# Patient Record
Sex: Female | Born: 1960 | Race: White | Hispanic: No | Marital: Married | Smoking: Never smoker
Health system: Southern US, Community
[De-identification: ages and names within clinical notes are randomized; demographics above are authoritative.]

## PROBLEM LIST (undated history)

## (undated) DIAGNOSIS — E079 Disorder of thyroid, unspecified: Secondary | ICD-10-CM

## (undated) DIAGNOSIS — E039 Hypothyroidism, unspecified: Secondary | ICD-10-CM

## (undated) DIAGNOSIS — Z780 Asymptomatic menopausal state: Secondary | ICD-10-CM

## (undated) DIAGNOSIS — T884XXA Failed or difficult intubation, initial encounter: Secondary | ICD-10-CM

## (undated) DIAGNOSIS — E119 Type 2 diabetes mellitus without complications: Secondary | ICD-10-CM

## (undated) DIAGNOSIS — R011 Cardiac murmur, unspecified: Secondary | ICD-10-CM

## (undated) DIAGNOSIS — F419 Anxiety disorder, unspecified: Secondary | ICD-10-CM

## (undated) DIAGNOSIS — I10 Essential (primary) hypertension: Secondary | ICD-10-CM

## (undated) DIAGNOSIS — Z87442 Personal history of urinary calculi: Secondary | ICD-10-CM

## (undated) HISTORY — DX: Disorder of thyroid, unspecified: E07.9

## (undated) HISTORY — DX: Type 2 diabetes mellitus without complications: E11.9

## (undated) HISTORY — PX: CHOLECYSTECTOMY: SHX55

## (undated) HISTORY — DX: Essential (primary) hypertension: I10

## (undated) HISTORY — DX: Asymptomatic menopausal state: Z78.0

---

## 1999-07-14 DIAGNOSIS — Z87442 Personal history of urinary calculi: Secondary | ICD-10-CM

## 1999-07-14 HISTORY — DX: Personal history of urinary calculi: Z87.442

## 2011-09-05 ENCOUNTER — Inpatient Hospital Stay: Payer: Self-pay | Admitting: Surgery

## 2011-09-05 LAB — URINALYSIS, COMPLETE
Bacteria: NONE SEEN
Bilirubin,UR: NEGATIVE
Glucose,UR: NEGATIVE mg/dL (ref 0–75)
Hyaline Cast: 8
Ketone: NEGATIVE
Leukocyte Esterase: NEGATIVE
Nitrite: NEGATIVE
Ph: 5 (ref 4.5–8.0)
Protein: NEGATIVE
RBC,UR: 2 /HPF (ref 0–5)
Specific Gravity: 1.024 (ref 1.003–1.030)
Squamous Epithelial: 2
WBC UR: 4 /HPF (ref 0–5)

## 2011-09-05 LAB — COMPREHENSIVE METABOLIC PANEL
Alkaline Phosphatase: 70 U/L (ref 50–136)
Bilirubin,Total: 0.7 mg/dL (ref 0.2–1.0)
Calcium, Total: 10.1 mg/dL (ref 8.5–10.1)
Chloride: 98 mmol/L (ref 98–107)
Co2: 26 mmol/L (ref 21–32)
Creatinine: 1.55 mg/dL — ABNORMAL HIGH (ref 0.60–1.30)
EGFR (Non-African Amer.): 37 — ABNORMAL LOW
Glucose: 158 mg/dL — ABNORMAL HIGH (ref 65–99)
Osmolality: 281 (ref 275–301)
SGPT (ALT): 74 U/L
Sodium: 136 mmol/L (ref 136–145)
Total Protein: 7.3 g/dL (ref 6.4–8.2)

## 2011-09-05 LAB — CBC
HCT: 43.8 % (ref 35.0–47.0)
MCV: 86 fL (ref 80–100)
RBC: 5.09 10*6/uL (ref 3.80–5.20)
WBC: 13.2 10*3/uL — ABNORMAL HIGH (ref 3.6–11.0)

## 2011-09-05 LAB — LIPASE, BLOOD: Lipase: >3000 U/L (ref 73–393)

## 2011-09-06 LAB — CBC WITH DIFFERENTIAL/PLATELET
Basophil #: 0 10*3/uL (ref 0.0–0.1)
Basophil %: 0.1 %
Eosinophil #: 0 10*3/uL (ref 0.0–0.7)
Eosinophil %: 0 %
HCT: 37.5 % (ref 35.0–47.0)
Lymphocyte %: 4.4 %
MCH: 29 pg (ref 26.0–34.0)
MCV: 87 fL (ref 80–100)
Monocyte %: 7.3 %
Neutrophil #: 8.7 10*3/uL — ABNORMAL HIGH (ref 1.4–6.5)
RDW: 14.5 % (ref 11.5–14.5)
WBC: 9.9 10*3/uL (ref 3.6–11.0)

## 2011-09-06 LAB — COMPREHENSIVE METABOLIC PANEL
Albumin: 3 g/dL — ABNORMAL LOW (ref 3.4–5.0)
Alkaline Phosphatase: 54 U/L (ref 50–136)
Anion Gap: 11 (ref 7–16)
Calcium, Total: 8.2 mg/dL — ABNORMAL LOW (ref 8.5–10.1)
Chloride: 103 mmol/L (ref 98–107)
EGFR (African American): >60
EGFR (Non-African Amer.): 54 — ABNORMAL LOW
Glucose: 103 mg/dL — ABNORMAL HIGH (ref 65–99)
Osmolality: 285 (ref 275–301)
Potassium: 3.6 mmol/L (ref 3.5–5.1)
SGOT(AST): 67 U/L — ABNORMAL HIGH (ref 15–37)

## 2011-09-06 LAB — LIPASE, BLOOD: Lipase: 4550 U/L — ABNORMAL HIGH (ref 73–393)

## 2011-09-06 LAB — LIPID PANEL
Ldl Cholesterol, Calc: 41 mg/dL (ref 0–100)
Triglycerides: 73 mg/dL (ref 0–200)

## 2011-09-06 LAB — MAGNESIUM: Magnesium: 1.5 mg/dL — ABNORMAL LOW

## 2011-09-07 LAB — COMPREHENSIVE METABOLIC PANEL
Anion Gap: 13 (ref 7–16)
Bilirubin,Total: 0.5 mg/dL (ref 0.2–1.0)
Chloride: 109 mmol/L — ABNORMAL HIGH (ref 98–107)
Co2: 23 mmol/L (ref 21–32)
Creatinine: 0.82 mg/dL (ref 0.60–1.30)
EGFR (African American): >60
EGFR (Non-African Amer.): >60
Osmolality: 290 (ref 275–301)
Potassium: 4.3 mmol/L (ref 3.5–5.1)
SGPT (ALT): 30 U/L
Sodium: 145 mmol/L (ref 136–145)

## 2011-09-07 LAB — PROTIME-INR
INR: 1.2
Prothrombin Time: 15.3 secs — ABNORMAL HIGH (ref 11.5–14.7)

## 2011-09-08 LAB — HEPATIC FUNCTION PANEL A (ARMC)
Albumin: 2.6 g/dL — ABNORMAL LOW (ref 3.4–5.0)
Alkaline Phosphatase: 49 U/L — ABNORMAL LOW (ref 50–136)
Bilirubin, Direct: 0.1 mg/dL (ref 0.00–0.20)
Bilirubin,Total: 0.5 mg/dL (ref 0.2–1.0)
SGPT (ALT): 23 U/L

## 2011-09-09 LAB — CBC WITH DIFFERENTIAL/PLATELET
Eosinophil #: 0.1 10*3/uL (ref 0.0–0.7)
Eosinophil %: 1.3 %
Lymphocyte #: 1.1 10*3/uL (ref 1.0–3.6)
Monocyte #: 0.8 10*3/uL — ABNORMAL HIGH (ref 0.0–0.7)
Monocyte %: 7.5 %
Neutrophil #: 8.2 10*3/uL — ABNORMAL HIGH (ref 1.4–6.5)
Neutrophil %: 79.8 %
Platelet: 224 10*3/uL (ref 150–440)
RBC: 3.37 10*6/uL — ABNORMAL LOW (ref 3.80–5.20)
WBC: 10.2 10*3/uL (ref 3.6–11.0)

## 2011-09-09 LAB — COMPREHENSIVE METABOLIC PANEL
Albumin: 2.2 g/dL — ABNORMAL LOW (ref 3.4–5.0)
Alkaline Phosphatase: 44 U/L — ABNORMAL LOW (ref 50–136)
Bilirubin,Total: 0.3 mg/dL (ref 0.2–1.0)
Calcium, Total: 7.8 mg/dL — ABNORMAL LOW (ref 8.5–10.1)
Co2: 26 mmol/L (ref 21–32)
Creatinine: 0.56 mg/dL — ABNORMAL LOW (ref 0.60–1.30)
EGFR (African American): >60
EGFR (Non-African Amer.): >60
Potassium: 4.1 mmol/L (ref 3.5–5.1)
SGOT(AST): 21 U/L (ref 15–37)
SGPT (ALT): 18 U/L
Sodium: 141 mmol/L (ref 136–145)

## 2013-02-09 IMAGING — US ABDOMEN ULTRASOUND LIMITED
1 series · 17 of 25 positions shown · non-contrast
Comparison: none

REASON FOR EXAM: RUQ pain n/v +murphy sign
COMMENTS:   Body Site: GB and Fossa, CBD, Head of Pancreas

PROCEDURE:     US  - US ABDOMEN LIMITED SURVEY  - September 05, 2011  [DATE]
RESULT:     Comparison: None.
TECHNIQUE: Multiple grayscale and color Doppler images were obtained of the
right upper quadrant.

[Series 1: abdomen ultrasound limited · 17 of 38 slices shown]
[im 1/38]
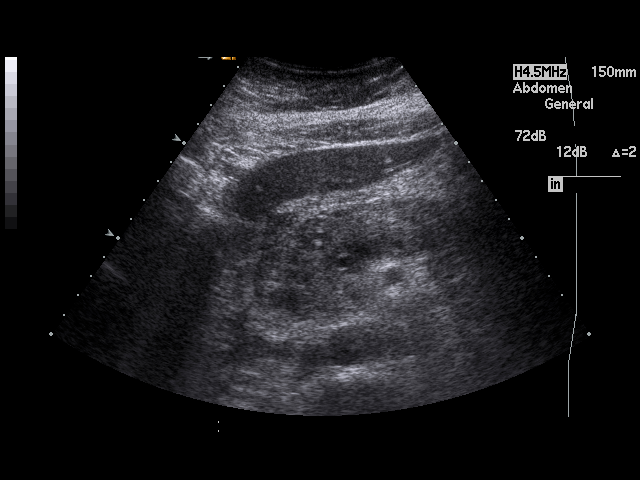
[im 4/38]
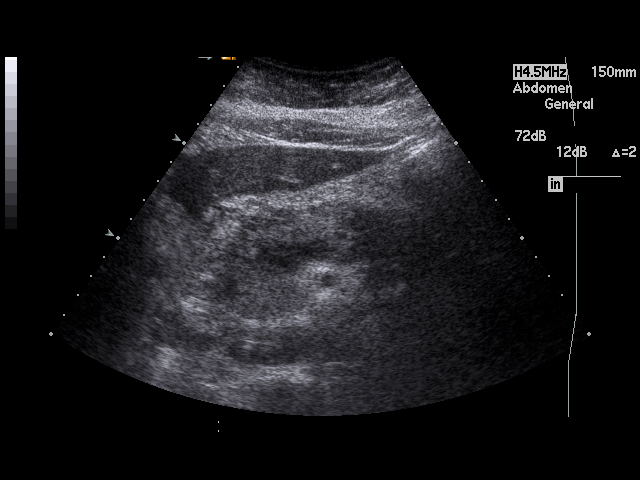
[im 5/38]
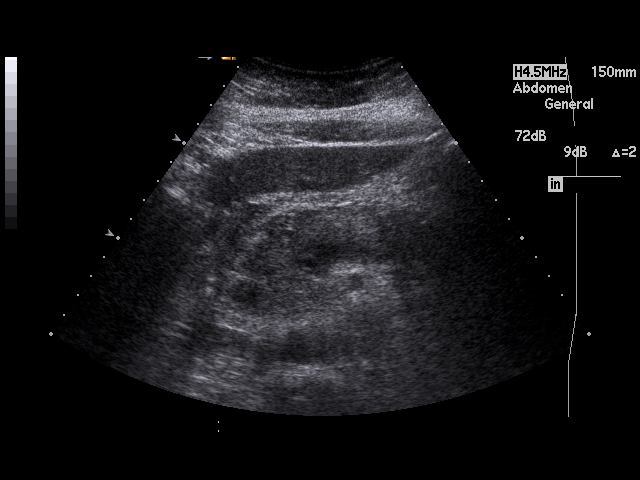
[im 8/38]
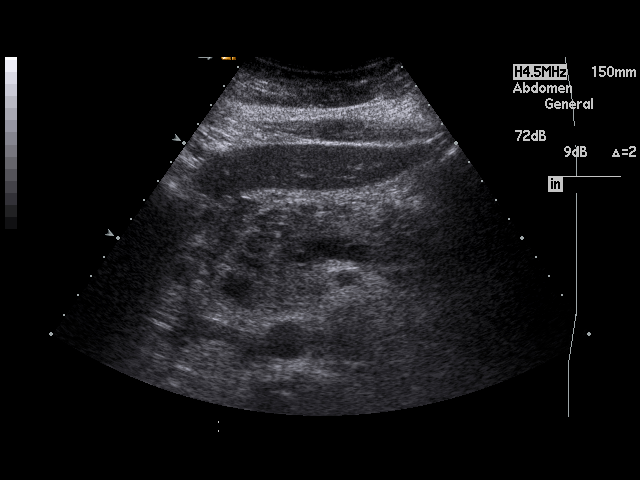
[im 10/38]
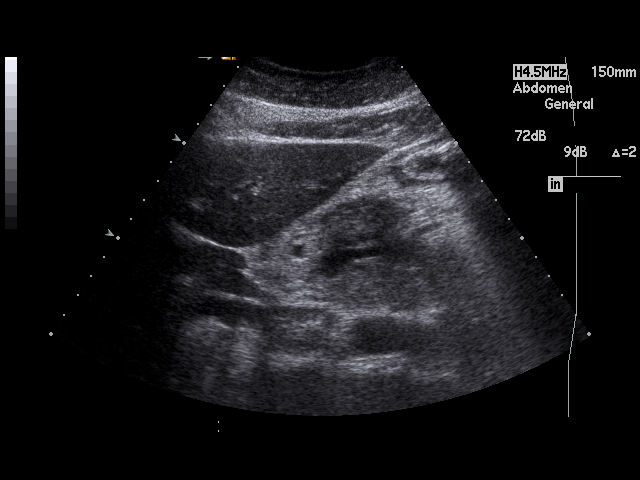
[im 13/38]
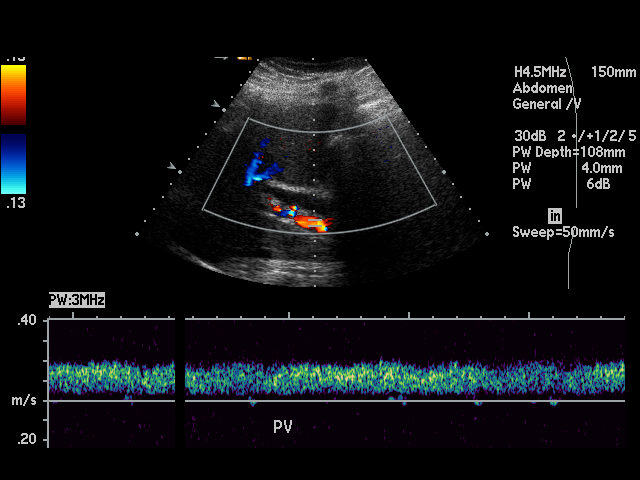
[im 14/38]
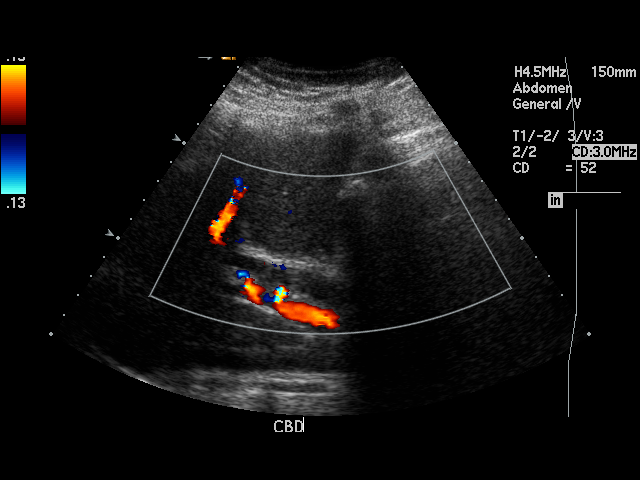
[im 17/38]
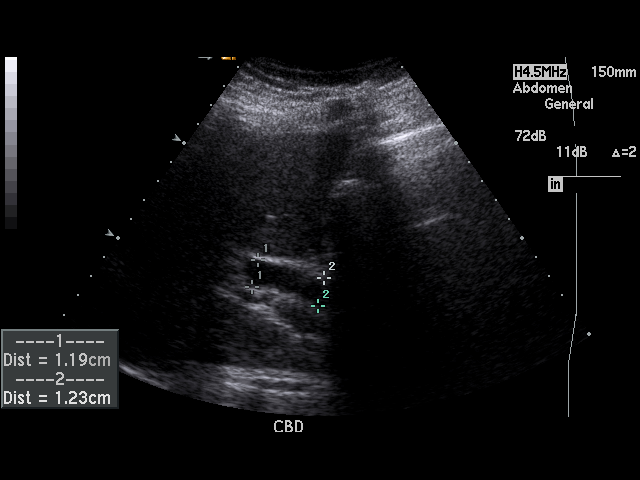
[im 19/38]
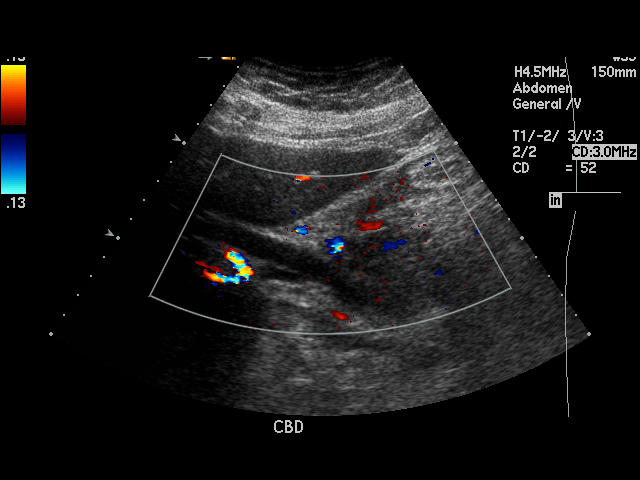
[im 21/38]
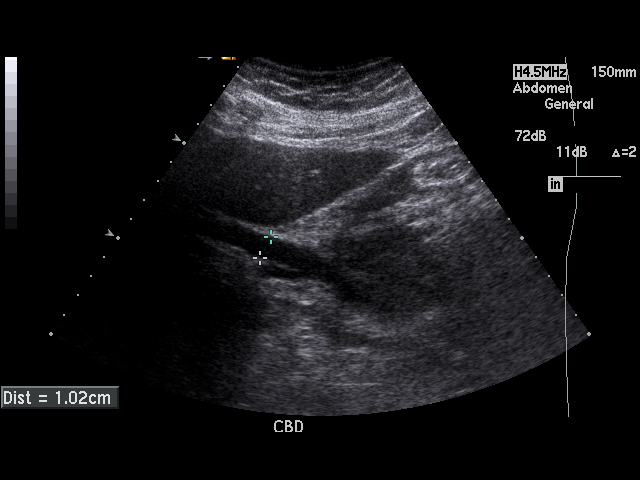
[im 24/38]
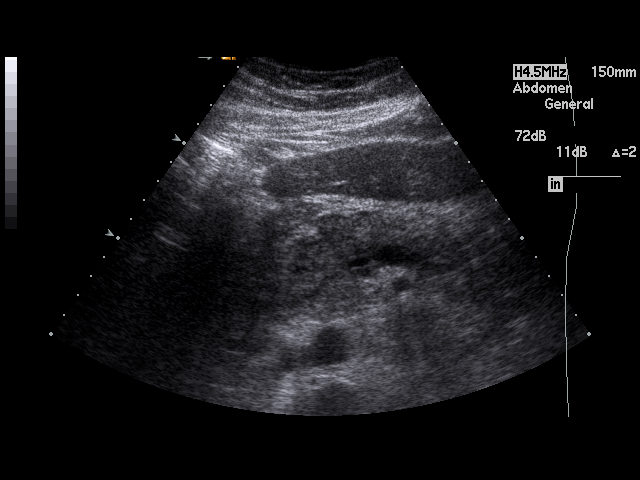
[im 25/38]
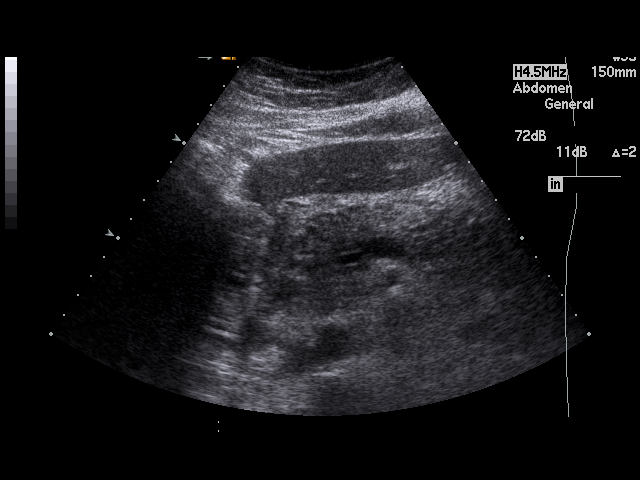
[im 28/38]
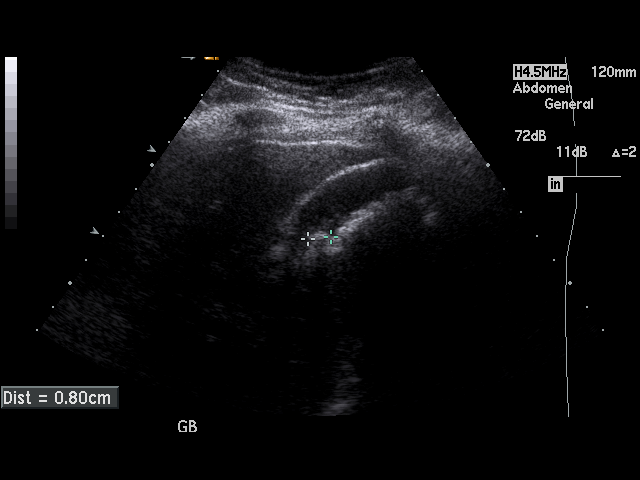
[im 30/38]
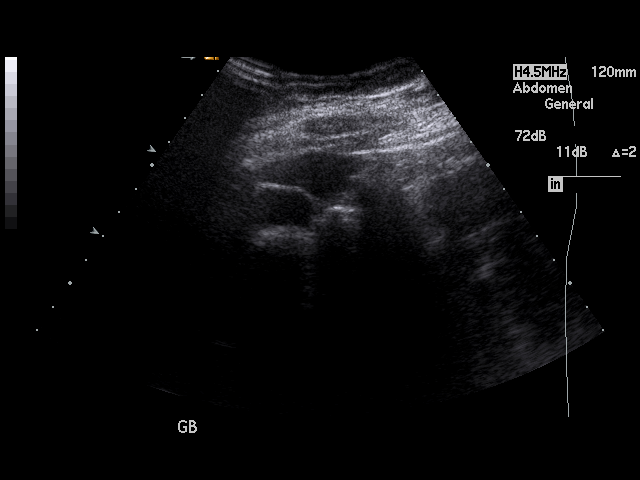
[im 33/38]
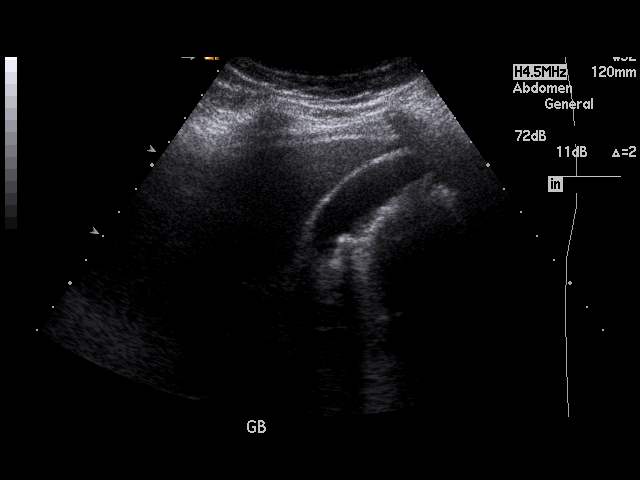
[im 34/38]
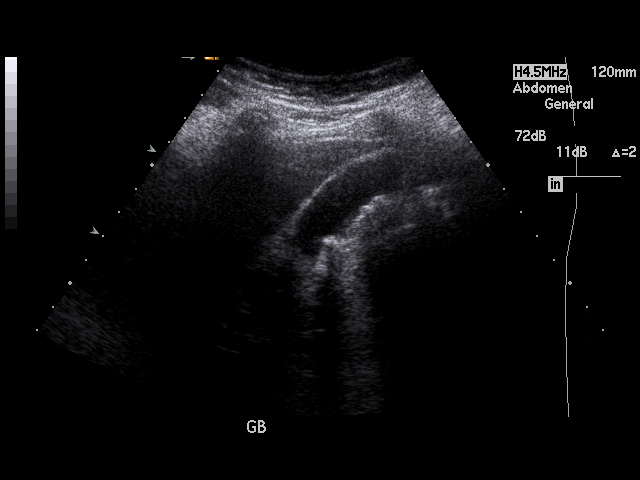
[im 38/38]
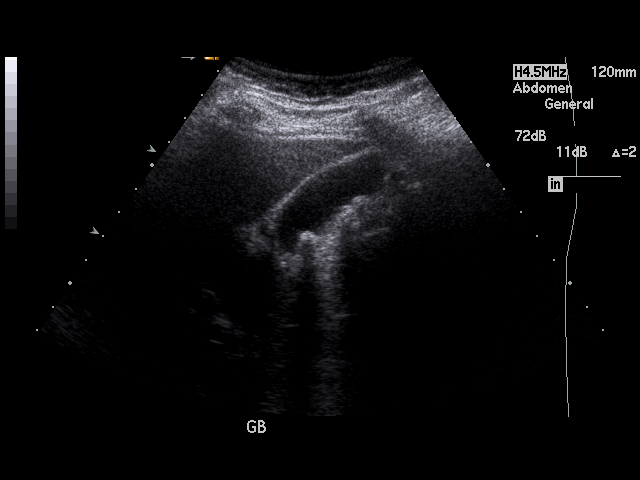

[17 of 25 positions shown; findings below may reference images not displayed]

FINDINGS: The pancreas is heterogeneous and appears enlarged.

The common bile duct is enlarged, measuring 12 mm in diameter. There are
multiple mobile stones in the gallbladder. The gallbladder wall is at the
upper limits of normal in thickness, measuring 3 mm. The sonographic Murphy
sign could not be accurately evaluated secondary to the patient medicated
state.
IMPRESSION: 1. Cholelithiasis. Gallbladder wall thickness is at the upper limits of
normal. Sonogram Murphy sign could not be accurately evaluated secondary to
patient's medicated state. Correlate clinically for acute cholecystitis.
2. The pancreas is enlarged and heterogeneous. This is nonspecific.
Correlate for pancreatitis.
3. The common bile duct is enlarged. The etiology for this dilatation is not
identified on this study. Further evaluation could be provided with M.R.C.P.
or ERCP, as indicated.

## 2014-11-04 NOTE — Discharge Summary (Signed)
PATIENT NAME:  Karla Hunt, SEELYE MR#:  [id number] DATE OF BIRTH:  17-Dec-1960  DATE OF ADMISSION:  09/05/2011 DATE OF DISCHARGE:  09/10/2011  PRINCIPLE DIAGNOSIS: Gallstone pancreatitis.   OTHER DIAGNOSES:  1. Hypertension.  2. Hypothyroidism.  3. Dyslipidemia.  4. Anxiety disorder.   PRINCIPLE PROCEDURE PERFORMED DURING THIS ADMISSION: Laparoscopic cholecystectomy.   HOSPITAL COURSE: Ms. Vanpelt was admitted to the internal medicine service with a lipase greater than 4000 for two days, an initial elevation in her creatinine with improvement on hydration, a mild elevation of her SGOT which resolved, a 12 mm common bile duct and a gallbladder wall that was measured at 3 mm in thickness with no obvious common bile duct stone but obvious stones in the gallbladder. With conservative management she improved and her bowel function returned and she was taken to the Operating Room where she underwent the above-mentioned procedure. I was unable to obtain an intraoperative cholangiogram because there was a stone that was impacted in the cystic duct. I put the clips between that stone and the common bile duct (which could be clearly visualized). Her pancreatitis was fairly severe by laparoscopy with a large retroperitoneal phlegmon and generalized saponification. On postoperative day one she was doing well, she was tolerating a diet and passing flatus. Her pain was controlled with p.o. Norco. She requested a cane and refused physical therapy and therefore was discharged home with a cane. A dietary consult was obtained for instruction prior to discharge in a low-fat diet and she was advised not to drink any alcohol until her pancreatitis had resolved. She was given an appointment to see me in 4 to 6 days in the [facility] office and told to call the office in the interim or return to the Emergency Room for any problems.  ____________________________ Consuela Mimes, MD wfm:cms D: 09/10/2011 08:09:23 ET T: 09/10/2011  09:35:26 ET  JOB#: [id number] cc: Consuela Mimes, MD, <Dictator> Consuela Mimes MD ELECTRONICALLY SIGNED 09/10/2011 9:46

## 2014-11-04 NOTE — Consult Note (Signed)
Full consult to follow. Abd pain with nausea/vomiting since Thurs. Now with gallstone pancreatitis. U/S show gallstones and dilated CBD. T.bili nl. So, unclear if patient still has CBD stone or if it passed spontaneously. Increase IVF. Keep NPO. Once pancreatitis resolves, will need GB removed. Will moniter LFT. IF t.bili starts rising, then plan ERCP later. Will follow. Thanks.  Electronic Signatures: Verdie Shire (MD)  (Signed on 23-Feb-13 11:45)  Authored  Last Updated: 23-Feb-13 11:45 by Verdie Shire (MD)

## 2014-11-04 NOTE — Consult Note (Signed)
PATIENT NAME:  Karla Hunt, MORRO MR#:  [id number] DATE OF BIRTH:  13-Dec-1960  DATE OF CONSULTATION:  09/06/2011  REFERRING PHYSICIAN:   CONSULTING PHYSICIAN:  Consuela Mimes, MD  HISTORY OF PRESENT ILLNESS: Karla Hunt is a 54 year old white female with no previous history of right upper quadrant pain, nausea or right shoulder pain or any previous history of her current symptomatology. On Thursday (three days ago), she began experiencing abdominal pain that radiated to her back and was associated with nausea and vomiting. She denies fever. Currently, she is not nauseous and is thirsty, but she is not passing any flatus per history and she is burping a lot. The patient was admitted to the hospital yesterday with a white blood cell count of 13,000 and today it has normalized. Her bilirubin, SGOT, SGPT have all been stable and for the most part in the normal range, but her SGOT is mildly elevated at 80 and 67. Her lipase has been greater than 4,000 for the last two days and on admission her creatinine was elevated to 1.55, but that has improved to 1.12 and is associated with normal electrolytes. An abdominal ultrasound on admission revealed an enlarged heterogeneous pancreas, a 12 mm common bile duct, a gallbladder wall that measured 3 mm in thickness and no obvious common bile duct stone.   PAST MEDICAL HISTORY:  1. Hypertension.  2. Hypothyroidism.  3. Dyslipidemia.  4. Possible generalized anxiety disorder.   HOME MEDICATIONS: 1. Dexilant 60 mg daily.  2. Hydrochlorothiazide 25 mg daily.  3. Reglan 10 mg t.i.d.  4. Propranolol 20 mg p.r.n. anxiety. 5. Levothyroxine 137 mcg daily.  6. Trilipix 135 mg daily.   ALLERGIES: Penicillin (rash).   PAST SURGICAL HISTORY: None.   FAMILY HISTORY: Noncontributory.   SOCIAL HISTORY: The patient is not employed outside the home. She is married. She and her husband are the only two individuals that live in the home. She does not smoke cigarettes and has  never smoked. She drinks approximately three glasses of wine three days a week with meals.   REVIEW OF SYSTEMS: Negative for 10 systems except as mentioned in the history of present illness above. Specific pertinent negatives are a lack of diarrhea and hematemesis and fever.   PHYSICAL EXAMINATION:  GENERAL: A middle-aged white female lying comfortably in the hospital bed. Height 5 feet, 4 inches, weight 175 pounds. BMI 30.1.   VITAL SIGNS: Temperature 99.7, pulse 118, respirations 20, blood pressure 126/82 and oxygen saturation 91% on room air.   HEENT: Pupils equally round and reactive to light. Extraocular movements are intact. Sclerae are anicteric. Oropharynx clear. Mucous membranes moist.   NECK: Supple with no lymphadenopathy. The trachea is midline and there is no jugular venous distention.   HEART: Regular rate and rhythm with no murmurs or rubs.   LUNGS: Clear to auscultation with normal respiratory effort bilaterally.   ABDOMEN: Obese versus protuberant and fairly quiet. Generally nontender, but with some tenderness to deep palpation in the epigastrium.   EXTREMITIES: No edema with normal capillary refill bilaterally.   NEUROLOGIC: Cranial nerves II through XII, motor and sensation grossly intact.   PSYCHIATRIC: Alert and oriented x4. Appropriate affect.   ASSESSMENT: Gallstone pancreatitis.   PLAN: Since there is not any objective evidence of acute cholecystitis, I will discontinue the antibiotics as the antibiotics have no proven benefit in the setting of acute pancreatitis. It is possible that her pancreatitis is due to alcohol or dyslipidemia, most likely secondary to her  gallstones. I have ordered sugar-free chewing gum lozenges, Chloraseptic spray and limited ice chips for comfort (for her dry mouth) and it would be best that she remain n.p.o. until she shows some objective signs of return of bowel function, such as passing flatus, less nausea, and less burping. Once she  is able to tolerate a clear liquid diet, I will schedule her for a laparoscopic cholecystectomy (hopefully the following day) and she may be able to be discharged home the day after surgery. Since her renal function has improved and her urine output has improved, a CT scan of the pancreas and upper abdomen with IV and oral contrast could be performed as an alternative to a MRCP if she is too anxious to undergo a MRCP. At this point, I do not believe that further imaging studies are absolutely required, but if she does not improve clinically and her laboratory values do not improve clinically over the next couple of days, either a CT scan or MRCP would be beneficial.    ____________________________ Consuela Mimes, MD wfm:ap D: 09/06/2011 13:47:49 ET T: 09/06/2011 14:08:30 ET JOB#: [id number]  cc: Consuela Mimes, MD, <Dictator> Consuela Mimes MD ELECTRONICALLY SIGNED 09/10/2011 9:46

## 2014-11-04 NOTE — H&P (Signed)
PATIENT NAME:  Karla Hunt, Karla Hunt MR#:  [id number] DATE OF BIRTH:  Nov 25, 1960  DATE OF ADMISSION:  09/05/2011  PRIMARY CARE PHYSICIAN:  Dr. Lamonte Sakai. ER physician: Dr. Robb Matar.   CHIEF COMPLAINT: Abdominal pain.   HISTORY OF PRESENT ILLNESS: The patient is a 54 year old female patient with history of hypertension, hypothyroidism, hyperlipidemia who came in because of nausea, vomiting, and abdominal pain. The patient's abdominal pain is more generalized and radiates to the back and started on Thursday. The patient went to Dr. Humphrey Rolls, had some blood work and x-rays and this abdominal pain got worse with persistent nausea so she came to the ER. The patient had no fever and no diarrhea. The patient was found to have pancreatitis with gallstones and I was asked to admit the patient.   PAST MEDICAL HISTORY:  1. Hypertension. 2. Hypothyroidism. 3. Hyperlipidemia,   ALLERGIES: Penicillin gives rash.   SOCIAL HISTORY: No smoker, occasional drinker. She is not working.   FAMILY HISTORY: Significant for history of hypertension and heart disease in the family.   MEDICATIONS:  1. Dexilant 60 mg extended release daily. 2. HCTZ 25 milligrams p.o. daily.  3. Reglan 10 mg 3 times a day.  4. Propranolol 20 mg as needed for anxiety situations.  5. Levothyroxine 137 mcg p.o. daily.  6. Trilipix 135 mg p.o. extended release once a day.   PAST SURGICAL HISTORY: None.   REVIEW OF SYSTEMS: CONSTITUTIONAL: No fever. No fatigue. EYES: No blurred vision. ENT: No tinnitus. No epistaxis. No difficulty swallowing. RESPIRATORY: Has no cough. No wheezing. CARDIOVASCULAR: No chest pain. No orthopnea. No palpitations. GASTROINTESTINAL: Has nausea, vomiting, and abdominal pain since Thursday. GENITOURINARY: No dysuria. ENDOCRINE: No polyuria or nocturia. HEMATOLOGIC: No anemia or easy bruising. INTEGUMENT: No skin rash. MUSCULOSKELETAL: No joint pain. NEUROLOGIC: No numbness or weakness. PSYCH: No anxiety or insomnia.  The patient does have a history of generalized anxiety, takes propranolol as needed.   PHYSICAL EXAMINATION:  VITAL SIGNS: Temperature initially 98.9, pulse 104, respirations 20, blood pressure 159/91, sats 94% on room air.   GENERAL: She is alert, awake, oriented.    HEENT: Atraumatic, normocephalic. Pupils are equally reacting to light. Extraocular movements are intact.   ENT: No tympanic membrane congestion. No turbinate hypertrophy. No oropharyngeal erythema.   NECK: Normal range of motion. No JVD. No carotid bruit.    CARDIOVASCULAR: S1, S2 regular. No murmur. PMI is nondisplaced. Good pedal pulses and femoral pulses are present. No extremity edema.   RESPIRATORY: Clear to auscultation. No wheeze. No rales and not using accessory muscles of respiration.   ABDOMEN: The patient's bowel sounds are diminished and has mild right upper quadrant and epigastric tenderness present. No hepatosplenomegaly.   MUSCULOSKELETAL: No joint pains. No cyanosis. No clubbing.   SKIN: No skin rashes.   NEUROLOGIC: The patient has no focal neurological deficit. Cranial nerves intact. Power is 5/5 in upper and lower extremities.   PSYCH: Oriented to time, place, and person.   LABORATORY, DIAGNOSTIC AND RADIOLOGICAL DATA: Hazy urine with 2+ blood and no bacteria. Ultrasound of the abdomen showed cholelithiasis with gallbladder wall thickening is at the upper limits of normal. Sonogram Murphy's sign could not be adequately evaluated secondary to the patient's medicated state. The patient also has pancreas which is enlarged and heterogenous nonspecific. Correlate for pancreatitis. Common bile duct is enlarged and the etiology of dilation is not identified on this study. The patient's lab data showed a white count 13.2, hemoglobin 14.9, hematocrit 43.8, platelets  339. Electrolytes: sodium 136, potassium 3.5, chloride 98, bicarbonate 26, BUN 29, creatinine 1.55.Glucose 158, lipase more than 3,000. ALT 74, AST 81,  alkaline phosphatase 70. Albumin 4.1. EKG not done. I will order one.   ASSESSMENT AND PLAN:  1. The patient is a 54 year old female patient with SIRS secondary to acute pancreatitis. The patient's acute pancreatitis is due to gallstones. Admit to the hospitalist service. Continue IV fluids, n.p.o. along with IV antibiotics. The patient will be seen by Dr. Candace Cruise for possible ERCP tomorrow and will check the lipase and amylase again and fasting lipase.  2. Acute renal failure secondary to acute pancreatitis. The patient will have IV fluids and hold HCTZ and check renal function in the morning.  3. Hypothyroidism. Continue n.p.o. and restart thyroid medications by IV.  4. The patient also has a history of hyperlipidemia. Check fasting lipids.  5. The patient has high blood pressure. Use metoprolol p.r.n. IV as she is n.p.o.   CONDITION ON DISCHARGE: Stable.   TIME SPENT ON HISTORY AND PHYSICAL: About 60 minutes.  CODE STATUS: FULL CODE.   ____________________________ Epifanio Lesches, MD sk:ap D: 09/05/2011 09:17:59 ET T: 09/05/2011 09:39:07 ET JOB#: [id number] cc: Epifanio Lesches, MD, <Dictator> Perrin Maltese, MD Epifanio Lesches MD ELECTRONICALLY SIGNED 09/06/2011 12:20

## 2014-11-04 NOTE — Op Note (Signed)
PATIENT NAME:  Karla Hunt, DARIN MR#:  [id number] DATE OF BIRTH:  1961/01/27  DATE OF PROCEDURE:  09/09/2011  PREOPERATIVE DIAGNOSIS: Gallstone pancreatitis (acute).   POSTOPERATIVE DIAGNOSIS: Gallstone pancreatitis (acute).   OPERATION PERFORMED: Laparoscopic cholecystectomy (with attempted intraoperative cholangiogram).   SURGEON: Consuela Mimes, MD   ANESTHESIA: General.   PROCEDURE IN DETAIL: The patient was placed supine on the operating room table and prepped and draped in the usual sterile fashion. A 15 mmHg CO2 pneumoperitoneum was created via a Veress needle in the infraumbilical position and this was replaced with a 5 mm trocar and a 30 degree angled scope. The patient was noted to have a very large bulge in the retroperitoneum consistent with a phlegmon-type pancreatitis and she also had fairly widespread saponification of the visceral and parietal peritoneal surfaces including the omentum. The gallbladder itself looked entirely normal. There was no inflammation surrounding the gallbladder and certainly no evidence of acute cholecystitis. The fundus of the gallbladder was retracted superiorly and ventrally. The infundibulum was retracted laterally. Visualization within the triangle of Calot was excellent. Prior to any dissection the common bile duct, common hepatic duct, cystic artery, and cystic duct could all be visualized. The cystic duct was fairly long. Dissection within the triangle of Calot allowed the cystic artery to be doubly clipped and divided and then a single clip was placed on the gallbladder infundibular cystic duct junction and a cystic ductotomy was created and multiple attempts at a cholangiogram were tried including two different cholangiogram catheters but no contrast could be gotten into the cystic duct. In fact, I had quite a bit of difficulty even flushing the cystic duct or getting the cannula to go down through what I thought originally were valves of Heister.  Ultimately the cholangiogram was aborted and I could feel what felt like an impacted small stone in the cystic duct stump and, therefore, I placed two hemoclips between that and the common bile duct. The remainder of the cystic duct felt soft. The gallbladder was then removed from the liver bed with electrocautery, placed in an EndoCatch bag, and extracted from the abdomen via the epigastric port. This port site fascia was closed with a single 0 Vicryl suture with the laparoscopic puncture closure device. The right upper quadrant was irrigated with warm normal saline. This was suctioned clear. The peritoneum was desufflated and decannulated and all four skin sites were closed with subcuticular 5-0 Monocryl and suture strips. The patient tolerated the procedure well. There were no complications.   ____________________________ Consuela Mimes, MD wfm:drc D: 09/09/2011 15:21:15 ET T: 09/09/2011 15:46:43 ET JOB#: [id number]  cc: Consuela Mimes, MD, <Dictator> Consuela Mimes MD ELECTRONICALLY SIGNED 09/10/2011 9:46

## 2014-11-04 NOTE — Consult Note (Signed)
Chief Complaint:   Subjective/Chief Complaint Minimal abd pain. Lipase coming down though still elevated. Started on liquid diet today. Surgery being consulted.   VITAL SIGNS/ANCILLARY NOTES: **Vital Signs.:   25-Feb-13 14:10   Vital Signs Type Routine   Pulse Pulse 101   Respirations Respirations 18   Systolic BP Systolic BP 449   Diastolic BP (mmHg) Diastolic BP (mmHg) 76   Mean BP 92   BP Source Dinamap   Pulse Ox % Pulse Ox % 95   Pulse Ox Activity Level  At rest   Oxygen Delivery 2L   Brief Assessment:   Cardiac Regular    Respiratory clear BS    Gastrointestinal minimal abd tenderness   Routine Chem:  25-Feb-13 04:29    Lipase 1184   Glucose, Serum 91   BUN 18   Creatinine (comp) 0.82   Sodium, Serum 145   Potassium, Serum 4.3   Chloride, Serum 109   CO2, Serum 23   Calcium (Total), Serum 7.8  Hepatic:  25-Feb-13 04:29    Bilirubin, Total 0.5   Alkaline Phosphatase 47   SGPT (ALT) 30   SGOT (AST) 53   Total Protein, Serum 6.1   Albumin, Serum 2.7  Routine Chem:  25-Feb-13 04:29    Osmolality (calc) 290   eGFR (African American) >60   eGFR (Non-African American) >60   Anion Gap 13   Amylase, Serum 468  Routine Coag:  25-Feb-13 04:29    Prothrombin 15.3   INR 1.2   Activated PTT (APTT) 32.6   Assessment/Plan:  Assessment/Plan:   Assessment Pancreatitis -resolving. Surgery consulted.    Plan GB surgery once pancreatitis has completely resolved. Pt wants surgery while still here. That is to be determined by surgery. Continue IV hydration and moniter labs. I will be at [facility] tomorrow. Will check back on Wed. Thanks.   Electronic Signatures: Verdie Shire (MD)  (Signed [phone] 14:19)  Authored: Chief Complaint, VITAL SIGNS/ANCILLARY NOTES, Brief Assessment, Lab Results, Assessment/Plan   Last Updated: 25-Feb-13 14:19 by Verdie Shire (MD)

## 2014-11-04 NOTE — Consult Note (Signed)
PATIENT NAME:  Karla Hunt, Karla Hunt MR#:  [id number] DATE OF BIRTH:  1961-06-04  DATE OF CONSULTATION:  09/05/2011  REFERRING PHYSICIAN:   CONSULTING PHYSICIAN:  Lupita Dawn. Candace Cruise, MD  REASON FOR REFERRAL: Gallstone pancreatitis.   DESCRIPTION: The patient is a 54 year old white female with a history of hypothyroidism and hypertension who started having bouts of nausea, vomiting, and abdominal pain starting on Thursday. It got worse yesterday. She went to see her primary doctor who told her to come to the emergency room. She was found to have acute pancreatitis.   Right now she is feeling better because of the pain medication she is receiving.   PAST MEDICAL HISTORY: The patient denies having any issues with gallbladder or pancreas in the past. There is no family history of gallbladder disease.   PAST MEDICAL HISTORY: Hypertension, hypothyroidism, hyperlipidemia.   ALLERGIES: She is allergic to penicillin which gives rash.   SOCIAL HISTORY:  She does not smoke but occasionally drinks alcohol.   FAMILY HISTORY: Again, notable for heart disease and hypertension.   MEDICATIONS AT HOME: Reglan, propranolol, levothyroxine, Dexilant, hydrochlorothiazide, Reglan, and Trilipix.    PAST SURGICAL HISTORY:  The patient denies any surgical history.   REVIEW OF SYMPTOMS: There is no fever but some chills. There is no fatigue or weakness. There are no visual or hearing changes. There is no chest pain or palpitations. There is no coughing or shortness of breath. The patient clearly had some nausea, vomiting, abdominal pain since Thursday. The patient did not have any back pain. There is no gross hematochezia, melena, or change in bowel habits. Rest of the review of symptoms is negative.    PHYSICAL EXAMINATION:  GENERAL: Right now she appears to be in no acute distress because of the pain medication.   VITAL SIGNS:  Temperature is 98.2, pulse is 117, respirations 20, blood pressure 127/86, pulse rate is 94.    HEENT: Normocephalic, atraumatic head. Pupils are equally reactive. Throat was clear.   NECK: Supple.   CARDIAC: Regular rhythm and rate without murmurs.   LUNGS: Lungs are clear bilaterally.   ABDOMEN: Feels a little distended but may be just obese abdomen, is soft. There is diffuse tenderness but mostly in the upper abdomen. There is no rebound or guarding. There is no hepatomegaly. There are no palpable masses.   EXTREMITIES: No clubbing, cyanosis, edema.   LABORATORY DATA:  Sodium 136, potassium 3.4, chloride 98, CO2 of 26, BUN 29, creatinine is 1.55, glucose is 158. Lipase was greater than 3000. Calcium level is 10.1. Liver enzymes showed an AST of 81, ALT is normal at 74, total bilirubin is 0.7, alkaline phosphatase 70, both of which are normal. White count is 13.2, hemoglobin 14.9, platelet count 339,000. Urinalysis shows a little bit of blood.   She also had an ultrasound of the abdomen that shows evidence of pancreatitis. There are gallstones. The gallbladder wall appears slightly thickened. The common bile duct is dilated to 12 mm but no obvious stones are present within the gallbladder. Pancreas appears heterogenous and enlarged.   IMPRESSION: This is a patient with what appears to be first bout of gallstone pancreatitis.  The patient needs to be kept n.p.o. I would actually increase the fluids. We should continue with nausea and pain medication until the pancreatitis resolves. Once the pancreatitis resolves, the patient does need to have her gallbladder removed. In terms of the common bile duct, it is unclear whether there is still a stone present in the  common bile duct or note.  It is also possible she may have passed the stone spontaneously. At this time bilirubin appears normal. Certainly, if the liver enzyme especially the bilirubin starts rising over the next couple of days then we will definitely schedule an ERCP to evaluate the bile duct for any stones.     Thank you for  the referral. We will continue to follow the patient.     ____________________________ Lupita Dawn. Candace Cruise, MD pyo:vtd D: 09/05/2011 11:52:19 ET T: 09/05/2011 12:48:11 ET JOB#: [id number]  cc: Lupita Dawn. Candace Cruise, MD, <Dictator> Hulen Luster MD ELECTRONICALLY SIGNED 09/10/2011 8:33

## 2014-11-04 NOTE — Consult Note (Signed)
Chief Complaint:   Subjective/Chief Complaint Overall pain better and looks better though lipase still high. Urinating alot, which is to be expected with IV hydration. No SOB.   VITAL SIGNS/ANCILLARY NOTES: **Vital Signs.:   24-Feb-13 05:07   Vital Signs Type Routine   Temperature Temperature (F) 97.6   Celsius 36.4   Temperature Source oral   Pulse Pulse 113   Pulse source per Dinamap   Respirations Respirations 18   Systolic BP Systolic BP 478   Diastolic BP (mmHg) Diastolic BP (mmHg) 79   Mean BP 93   BP Source Dinamap   Pulse Ox % Pulse Ox % 92   Pulse Ox Activity Level  At rest   Oxygen Delivery Room Air/ 21 %   Brief Assessment:   Cardiac Regular    Respiratory clear BS    Gastrointestinal mild upper abd tenderness   Routine Hem:  24-Feb-13 04:36    WBC (CBC) 9.9   RBC (CBC) 4.29   Hemoglobin (CBC) 12.4   Hematocrit (CBC) 37.5   Platelet Count (CBC) 187   MCV 87   MCH 29.0   MCHC 33.2   RDW 14.5  Routine Chem:  24-Feb-13 04:36    Lipase 4550   Glucose, Serum 103   BUN 22   Creatinine (comp) 1.12   Sodium, Serum 141   Potassium, Serum 3.6   Chloride, Serum 103   CO2, Serum 27   Calcium (Total), Serum 8.2  Hepatic:  24-Feb-13 04:36    Bilirubin, Total 0.7   Alkaline Phosphatase 54   SGPT (ALT) 41   SGOT (AST) 67   Total Protein, Serum 6.0   Albumin, Serum 3.0  Routine Chem:  24-Feb-13 04:36    Osmolality (calc) 285   eGFR (African American) >60   eGFR (Non-African American) 54   Anion Gap 11  Routine Hem:  24-Feb-13 04:36    Neutrophil % 88.2   Lymphocyte % 4.4   Monocyte % 7.3   Eosinophil % 0.0   Basophil % 0.1   Neutrophil # 8.7   Lymphocyte # 0.4   Monocyte # 0.7   Eosinophil # 0.0   Basophil # 0.0  Routine Chem:  24-Feb-13 04:36    Magnesium, Serum 1.5   Cholesterol, Serum 121   Triglycerides, Serum 73   HDL (INHOUSE) 65   VLDL Cholesterol Calculated 15   LDL Cholesterol Calculated 41   Radiology Results: [place]:   23-Feb-13 07:51, US Abdomen Limited Survey   US Abdomen Limited Survey    REASON FOR EXAM:    RUQ pain n/v +murphy sign  COMMENTS:   Body Site: GB and Fossa, CBD, Head of Pancreas    PROCEDURE: [place]  - US ABDOMEN LIMITED SURVEY  - Sep 05 2011  7:51AM     RESULT: Comparison: None.    Technique: Multiple grayscale and color Doppler images were obtained of   the right upper quadrant.    Findings:  The pancreas is heterogeneous and appears enlarged.    The common bile duct is enlarged, measuring 12 mm in diameter. There are   multiple mobile stones in the gallbladder. The gallbladder wall is at the     upper limits of normal in thickness, measuring 3 mm. The sonographic   Percell Miller sign could not be accurately evaluated secondary to the patient   medicated state.    IMPRESSION:   1. Cholelithiasis. Gallbladder wall thickness is at the upper limits of   normal. Sonogram Murphy sign  could not be accurately evaluated secondary   to patient's medicated state. Correlate clinically for acute   cholecystitis.  2. The pancreas is enlarged and heterogeneous. This is nonspecific.  Correlate for pancreatitis.  3. The common bile duct is enlarged. The etiology for this dilatation is   not identified on this study. Further evaluation could be provided with   M.R.C.P. or ERCP, as indicated.      Verified By: Gregor Hams, M.D., MD   Assessment/Plan:  Assessment/Plan:   Assessment Acute pancreatitis. Clinically improving though lipase remains high. T.bili/alk phos remain normal.    Plan Continue NPO/IV hydration. No need for ERCP at this point.   Electronic Signatures: Verdie Shire (MD)  (Signed [phone] 08:50)  Authored: Chief Complaint, VITAL SIGNS/ANCILLARY NOTES, Brief Assessment, Lab Results, Radiology Results, Assessment/Plan   Last Updated: 24-Feb-13 08:50 by Verdie Shire (MD)

## 2014-11-04 NOTE — Consult Note (Signed)
Brief Consult Note: Diagnosis: acute pancreatitis, likely secondary to gallstones.   Patient was seen by consultant.   Consult note dictated.   Recommend to proceed with surgery or procedure.   Recommend further assessment or treatment.   Orders entered.   Comments: No further imaging studies required, but either a MRCP (if pt can do it) or a triple phase CT of pancreas and upper abdomen would be useful if she doesn't improve clinically over the next few days. I D/C-ed ABx as no definite evidence of acute cholecystitis (3 mm GB wall non-specific) and ABx are not beneficial in acute pancreatitis. Once she has return of bowel function (passing flatus, not burping, tolerating at least clear liquid diet), I will do a lap ccy w/ intraop cholangiogram. Only limited ice chips / gum / chloraseptic for comfort for now...  Electronic Signatures: Consuela Mimes (MD)  (Signed [phone] 14:00)  Authored: Brief Consult Note   Last Updated: 24-Feb-13 14:00 by Consuela Mimes (MD)

## 2016-08-20 ENCOUNTER — Other Ambulatory Visit: Payer: Self-pay | Admitting: Obstetrics and Gynecology

## 2018-02-10 ENCOUNTER — Inpatient Hospital Stay: Payer: BLUE CROSS/BLUE SHIELD | Attending: Oncology | Admitting: Oncology

## 2018-02-10 ENCOUNTER — Telehealth: Payer: Self-pay | Admitting: Oncology

## 2018-02-10 ENCOUNTER — Encounter: Payer: Self-pay | Admitting: Oncology

## 2018-02-10 ENCOUNTER — Other Ambulatory Visit: Payer: Self-pay

## 2018-02-10 VITALS — BP 127/85 | HR 83 | Temp 99.2°F | Resp 18 | Ht 64.0 in | Wt 162.3 lb

## 2018-02-10 DIAGNOSIS — D473 Essential (hemorrhagic) thrombocythemia: Secondary | ICD-10-CM | POA: Insufficient documentation

## 2018-02-10 DIAGNOSIS — D509 Iron deficiency anemia, unspecified: Secondary | ICD-10-CM | POA: Diagnosis present

## 2018-02-10 DIAGNOSIS — N950 Postmenopausal bleeding: Secondary | ICD-10-CM | POA: Diagnosis not present

## 2018-02-10 NOTE — Progress Notes (Signed)
Hematology/Oncology Consult note [facility] [phone] [phone]   Patient Care Team: Danelle Berry, NP as PCP - General (Nurse Practitioner)  REFERRING PROVIDER: Danelle Berry, NP CHIEF COMPLAINTS/REASON FOR VISIT:  Evaluation of postmenopausal bleeding   HISTORY OF PRESENTING ILLNESS:  Karla Hunt is a  57 y.o.  female with PMH listed below who was referred to me for evaluation of postmenopausal bleeding. Patient recently has had labs done at primary care physician's office.  Review labs faxed from [facility]. Patient had CBC done on 02/02/2018, WBC 5.3, hemoglobin 11.4, hematocrit 34.8, MCV 81, platelet count 3 99,000.   01/27/2018, CBC showed hemoglobin 9.2, MCV 72, platelet counts 481,000, WBC 4.5.  Vitamin B12 298, 02/13/2017, creatinine 1.13, A1c 5.9, TSH 0.208, vitamin D 64.6 03/19/2017, hemoglobin 10.9, MCV 77 platelet count 3 68,000  Patient reports feeling fatigued, gradual onset for the past couple of months.  Associated with shortness of breath with exertion.  No aggravating or alleviating factors. She had postmenopausal bleeding and has establish care with gynecologist.  Per patient she had a work-up and was told that malignancies were ruled out at this time.  Reviewed patient's medical charts in [facility] via care everywhere. Patient had endometrial biopsy on 5/22 2019 which showed inactive endometrium no hyperplasia or carcinoma.  Patient was started with Provera however she feels that this is elevating her blood sugar.  Other options are D&C patient , IUD or hysterectomy.  Patient will follow-up with GYN and discuss options.   She was started on Oral ferrous sulfate supplements however she  experiences more bleeding whenever she takes oral ferrous sulfate and so she stopped taking it.  Reports heavy Colonoscopy many years ago [Per PCP notewas done in 2013 with 10-year recommended repeat]  and not due  yet.  Denies any blood in the stool or change of bowel habits  Review of Systems  Constitutional: Positive for malaise/fatigue. Negative for chills, fever and weight loss.  HENT: Negative for nosebleeds and sore throat.   Eyes: Negative for double vision, photophobia and redness.  Respiratory: Negative for cough, shortness of breath and wheezing.   Cardiovascular: Negative for chest pain, palpitations and orthopnea.  Gastrointestinal: Negative for abdominal pain, blood in stool, nausea and vomiting.  Genitourinary: Negative for dysuria.  Musculoskeletal: Negative for back pain, myalgias and neck pain.  Skin: Negative for itching and rash.  Neurological: Negative for dizziness, tingling and tremors.  Endo/Heme/Allergies: Negative for environmental allergies. Does not bruise/bleed easily.  Psychiatric/Behavioral: Negative for depression.    MEDICAL HISTORY:  Past Medical History:  Diagnosis Date  . Diabetes mellitus without complication ([place])   . Hypertension   . Menopause   . Thyroid disease    hyperthyroidism    SURGICAL HISTORY: Past Surgical History:  Procedure Laterality Date  . CHOLECYSTECTOMY      SOCIAL HISTORY: Social History   Socioeconomic History  . Marital status: Married    Spouse name: Deidre Ala [place]  . Number of children: 2  . Years of education: Not on file  . Highest education level: Not on file  Occupational History  . Occupation: retired  Scientific laboratory technician  . Financial resource strain: Not on file  . Food insecurity:    Worry: Not on file    Inability: Not on file  . Transportation needs:    Medical: Not on file    Non-medical: Not on file  Tobacco Use  . Smoking status: Never Smoker  .  Smokeless tobacco: Never Used  Substance and Sexual Activity  . Alcohol use: Never    Frequency: Never  . Drug use: Never  . Sexual activity: Not Currently  Lifestyle  . Physical activity:    Days per week: Not on file    Minutes per session: Not on file  .  Stress: Not on file  Relationships  . Social connections:    Talks on phone: Not on file    Gets together: Not on file    Attends religious service: Not on file    Active member of club or organization: Not on file    Attends meetings of clubs or organizations: Not on file    Relationship status: Not on file  . Intimate partner violence:    Fear of current or ex partner: Not on file    Emotionally abused: Not on file    Physically abused: Not on file    Forced sexual activity: Not on file  Other Topics Concern  . Not on file  Social History Narrative  . Not on file    FAMILY HISTORY: Family History  Problem Relation Age of Onset  . Hypertension Mother   . Heart attack Father   . Heart failure Father     ALLERGIES:  is allergic to penicillin g.  MEDICATIONS:  Current Outpatient Medications  Medication Sig Dispense Refill  . Cholecalciferol (VITAMIN D3) 2000 units TABS Take 2,000 Units by mouth daily.    . Choline Fenofibrate (FENOFIBRIC ACID) 135 MG CPDR TK 1 C PO QD    . levothyroxine (SYNTHROID, LEVOTHROID) 150 MCG tablet TK 1 T PO QAM AC OES    . lisinopril (PRINIVIL,ZESTRIL) 10 MG tablet TK 1 T PO D IN THE MORNING FOR BP  3  . loratadine (CLARITIN) 10 MG tablet TK 1 T PO D  1  . losartan (COZAAR) 25 MG tablet TK 1 T PO D IN THE MORNING  1  . MEDROXYPROGESTERONE ACETATE PO Take 10 mg by mouth daily as needed.  0  . metFORMIN (GLUCOPHAGE) 500 MG tablet TK 1 T PO BID    . propranolol (INDERAL) 20 MG tablet Take 20 mg by mouth daily as needed.  5  . vitamin B-12 (CYANOCOBALAMIN) 500 MCG tablet Take 500 mcg by mouth every other day.     No current facility-administered medications for this visit.      PHYSICAL EXAMINATION: ECOG PERFORMANCE STATUS: 1 - Symptomatic but completely ambulatory Vitals:   02/10/18 0929  BP: 127/85  Pulse: 83  Resp: 18  Temp: 99.2 F (37.3 C)   Filed Weights   02/10/18 0929  Weight: 162 lb 4.1 oz (73.6 kg)    Physical Exam    Constitutional: She is oriented to person, place, and time. She appears well-developed and well-nourished. No distress.  HENT:  Head: Normocephalic and atraumatic.  Right Ear: External ear normal.  Left Ear: External ear normal.  Mouth/Throat: Oropharynx is clear and moist.  Eyes: Pupils are equal, round, and reactive to light. EOM are normal. No scleral icterus.  Neck: Normal range of motion. Neck supple.  Cardiovascular: Normal rate, regular rhythm and normal heart sounds.  Pulmonary/Chest: Effort normal. No respiratory distress. She has no wheezes.  Abdominal: Soft. Bowel sounds are normal. She exhibits no distension and no mass. There is no tenderness.  Musculoskeletal: Normal range of motion. She exhibits no edema or deformity.  Neurological: She is alert and oriented to person, place, and time. No cranial nerve  deficit. Coordination normal.  Skin: Skin is warm and dry. No rash noted. No erythema.  Psychiatric: She has a normal mood and affect. Her behavior is normal. Thought content normal.     LABORATORY DATA:  I have reviewed the data as listed Lab Results  Component Value Date   WBC 10.2 09/09/2011   HGB 9.8 (L) 09/09/2011   HCT 29.4 (L) 09/09/2011   MCV 87 09/09/2011   PLT 224 09/09/2011   No results for input(s): NA, K, CL, CO2, GLUCOSE, BUN, CREATININE, CALCIUM, GFRNONAA, GFRAA, PROT, ALBUMIN, AST, ALT, ALKPHOS, BILITOT, BILIDIR, IBILI in the last 8760 hours. Iron/TIBC/Ferritin/ %Sat No results found for: IRON, TIBC, FERRITIN, IRONPCTSAT      ASSESSMENT & PLAN:  1. Iron deficiency anemia, unspecified iron deficiency anemia type   2. Postmenopausal bleeding    Labs reviewed and discussed with patient.  She has microcytic anemia, which appears to improved while she was on oral ferrous sulfate supplements.  I do not have her baseline iron panel.  Per patient it was done at [facility].  So we call PCPs office to have baseline iron labs faxed to  [place]. She also has mild thrombocytosis which can also be secondary to iron deficiency. Recommend patient continue follow-up with GYN for further management of post menopausal bleeding.  Plan IV iron with Venofer 200mg  weekly x 4 doses. Allergy reactions/infusion reaction including anaphylactic reaction discussed with patient. Other side effects include but not limited to high blood pressure, skin rash, weight gain, leg swelling, etc. Patient voices understanding and willing to proceed.   Orders Placed This Encounter  Procedures  . CBC with Differential/Platelet    Standing Status:   Future    Standing Expiration Date:   02/10/2019  . Iron and TIBC    Standing Status:   Future    Standing Expiration Date:   02/11/2019  . Ferritin    Standing Status:   Future    Standing Expiration Date:   02/11/2019    All questions were answered. The patient knows to call the clinic with any problems questions or concerns.  Return of visit: 2 months with repeat CBC, iron TIBC ferritin. Thank you for this kind referral and the opportunity to participate in the care of this patient. A copy of today's note is routed to referring provider  Total face to face encounter time for this patient visit was 45 min. >50% of the time was  spent in counseling and coordination of care.    Earlie Server, MD, PhD Hematology Oncology [facility] at [facility] Pager- [phone] 02/10/2018

## 2018-02-10 NOTE — Progress Notes (Signed)
Patient here for initial visit. °

## 2018-02-10 NOTE — Telephone Encounter (Signed)
Left voicemail notifying patient that Dr. Tasia Catchings would like to get baseline labs on her and to call [facility] for lab appointment.

## 2018-02-10 NOTE — Addendum Note (Signed)
Addended by: Earlie Server on: 02/10/2018 02:06 PM   Modules accepted: Orders

## 2018-02-16 ENCOUNTER — Inpatient Hospital Stay: Payer: BLUE CROSS/BLUE SHIELD

## 2018-02-16 DIAGNOSIS — D509 Iron deficiency anemia, unspecified: Secondary | ICD-10-CM

## 2018-02-16 LAB — IRON AND TIBC
IRON: 26 ug/dL — AB (ref 28–170)
Saturation Ratios: 5 % — ABNORMAL LOW (ref 10.4–31.8)
TIBC: 519 ug/dL — AB (ref 250–450)
UIBC: 493 ug/dL

## 2018-02-16 LAB — FERRITIN: Ferritin: 2 ng/mL — ABNORMAL LOW (ref 11–307)

## 2018-02-17 ENCOUNTER — Inpatient Hospital Stay: Payer: BLUE CROSS/BLUE SHIELD

## 2018-02-17 VITALS — BP 122/77 | HR 80 | Temp 98.8°F | Resp 16

## 2018-02-17 DIAGNOSIS — D509 Iron deficiency anemia, unspecified: Secondary | ICD-10-CM | POA: Diagnosis not present

## 2018-02-17 MED ORDER — IRON SUCROSE 20 MG/ML IV SOLN
200.0000 mg | Freq: Once | INTRAVENOUS | Status: AC
  Administered 2018-02-17: 200 mg via INTRAVENOUS
  Filled 2018-02-17: qty 10

## 2018-02-17 MED ORDER — SODIUM CHLORIDE 0.9 % IV SOLN
Freq: Once | INTRAVENOUS | Status: AC
  Administered 2018-02-17: 11:00:00 via INTRAVENOUS
  Filled 2018-02-17: qty 1000

## 2018-02-17 NOTE — Patient Instructions (Signed)
Iron Sucrose injection What is this medicine? IRON SUCROSE (AHY ern SOO krohs) is an iron complex. Iron is used to make healthy red blood cells, which carry oxygen and nutrients throughout the body. This medicine is used to treat iron deficiency anemia in people with chronic kidney disease. This medicine may be used for other purposes; ask your health care provider or pharmacist if you have questions. COMMON BRAND NAME(S): Venofer What should I tell my health care provider before I take this medicine? They need to know if you have any of these conditions: -anemia not caused by low iron levels -heart disease -high levels of iron in the blood -kidney disease -liver disease -an unusual or allergic reaction to iron, other medicines, foods, dyes, or preservatives -pregnant or trying to get pregnant -breast-feeding How should I use this medicine? This medicine is for infusion into a vein. It is given by a health care professional in a hospital or clinic setting. Talk to your pediatrician regarding the use of this medicine in children. While this drug may be prescribed for children as young as 2 years for selected conditions, precautions do apply. Overdosage: If you think you have taken too much of this medicine contact a poison control center or emergency room at once. NOTE: This medicine is only for you. Do not share this medicine with others. What if I miss a dose? It is important not to miss your dose. Call your doctor or health care professional if you are unable to keep an appointment. What may interact with this medicine? Do not take this medicine with any of the following medications: -deferoxamine -dimercaprol -other iron products This medicine may also interact with the following medications: -chloramphenicol -deferasirox This list may not describe all possible interactions. Give your health care provider a list of all the medicines, herbs, non-prescription drugs, or dietary  supplements you use. Also tell them if you smoke, drink alcohol, or use illegal drugs. Some items may interact with your medicine. What should I watch for while using this medicine? Visit your doctor or healthcare professional regularly. Tell your doctor or healthcare professional if your symptoms do not start to get better or if they get worse. You may need blood work done while you are taking this medicine. You may need to follow a special diet. Talk to your doctor. Foods that contain iron include: whole grains/cereals, dried fruits, beans, or peas, leafy green vegetables, and organ meats (liver, kidney). What side effects may I notice from receiving this medicine? Side effects that you should report to your doctor or health care professional as soon as possible: -allergic reactions like skin rash, itching or hives, swelling of the face, lips, or tongue -breathing problems -changes in blood pressure -cough -fast, irregular heartbeat -feeling faint or lightheaded, falls -fever or chills -flushing, sweating, or hot feelings -joint or muscle aches/pains -seizures -swelling of the ankles or feet -unusually weak or tired Side effects that usually do not require medical attention (report to your doctor or health care professional if they continue or are bothersome): -diarrhea -feeling achy -headache -irritation at site where injected -nausea, vomiting -stomach upset -tiredness This list may not describe all possible side effects. Call your doctor for medical advice about side effects. You may report side effects to FDA at 1-800-FDA-1088. Where should I keep my medicine? This drug is given in a hospital or clinic and will not be stored at home. NOTE: This sheet is a summary. It may not cover all possible information. If   you have questions about this medicine, talk to your doctor, pharmacist, or health care provider.  2018 Elsevier/Gold Standard (2011-04-09 17:14:35)  

## 2018-02-24 ENCOUNTER — Inpatient Hospital Stay: Payer: BLUE CROSS/BLUE SHIELD

## 2018-02-24 ENCOUNTER — Encounter: Payer: Self-pay | Admitting: Nurse Practitioner

## 2018-02-24 VITALS — BP 136/84 | HR 79 | Temp 97.8°F | Resp 17

## 2018-02-24 DIAGNOSIS — D509 Iron deficiency anemia, unspecified: Secondary | ICD-10-CM | POA: Diagnosis not present

## 2018-02-24 MED ORDER — SODIUM CHLORIDE 0.9 % IV SOLN
Freq: Once | INTRAVENOUS | Status: AC
  Administered 2018-02-24: 10:00:00 via INTRAVENOUS
  Filled 2018-02-24: qty 1000

## 2018-02-24 MED ORDER — IRON SUCROSE 20 MG/ML IV SOLN
200.0000 mg | Freq: Once | INTRAVENOUS | Status: AC
  Administered 2018-02-24: 200 mg via INTRAVENOUS
  Filled 2018-02-24: qty 10

## 2018-02-24 NOTE — Patient Instructions (Signed)
Iron Sucrose injection What is this medicine? IRON SUCROSE (AHY ern SOO krohs) is an iron complex. Iron is used to make healthy red blood cells, which carry oxygen and nutrients throughout the body. This medicine is used to treat iron deficiency anemia in people with chronic kidney disease. This medicine may be used for other purposes; ask your health care provider or pharmacist if you have questions. COMMON BRAND NAME(S): Venofer What should I tell my health care provider before I take this medicine? They need to know if you have any of these conditions: -anemia not caused by low iron levels -heart disease -high levels of iron in the blood -kidney disease -liver disease -an unusual or allergic reaction to iron, other medicines, foods, dyes, or preservatives -pregnant or trying to get pregnant -breast-feeding How should I use this medicine? This medicine is for infusion into a vein. It is given by a health care professional in a hospital or clinic setting. Talk to your pediatrician regarding the use of this medicine in children. While this drug may be prescribed for children as young as 2 years for selected conditions, precautions do apply. Overdosage: If you think you have taken too much of this medicine contact a poison control center or emergency room at once. NOTE: This medicine is only for you. Do not share this medicine with others. What if I miss a dose? It is important not to miss your dose. Call your doctor or health care professional if you are unable to keep an appointment. What may interact with this medicine? Do not take this medicine with any of the following medications: -deferoxamine -dimercaprol -other iron products This medicine may also interact with the following medications: -chloramphenicol -deferasirox This list may not describe all possible interactions. Give your health care provider a list of all the medicines, herbs, non-prescription drugs, or dietary  supplements you use. Also tell them if you smoke, drink alcohol, or use illegal drugs. Some items may interact with your medicine. What should I watch for while using this medicine? Visit your doctor or healthcare professional regularly. Tell your doctor or healthcare professional if your symptoms do not start to get better or if they get worse. You may need blood work done while you are taking this medicine. You may need to follow a special diet. Talk to your doctor. Foods that contain iron include: whole grains/cereals, dried fruits, beans, or peas, leafy green vegetables, and organ meats (liver, kidney). What side effects may I notice from receiving this medicine? Side effects that you should report to your doctor or health care professional as soon as possible: -allergic reactions like skin rash, itching or hives, swelling of the face, lips, or tongue -breathing problems -changes in blood pressure -cough -fast, irregular heartbeat -feeling faint or lightheaded, falls -fever or chills -flushing, sweating, or hot feelings -joint or muscle aches/pains -seizures -swelling of the ankles or feet -unusually weak or tired Side effects that usually do not require medical attention (report to your doctor or health care professional if they continue or are bothersome): -diarrhea -feeling achy -headache -irritation at site where injected -nausea, vomiting -stomach upset -tiredness This list may not describe all possible side effects. Call your doctor for medical advice about side effects. You may report side effects to FDA at 1-800-FDA-1088. Where should I keep my medicine? This drug is given in a hospital or clinic and will not be stored at home. NOTE: This sheet is a summary. It may not cover all possible information. If   you have questions about this medicine, talk to your doctor, pharmacist, or health care provider.  2018 Elsevier/Gold Standard (2011-04-09 17:14:35)  

## 2018-03-03 ENCOUNTER — Inpatient Hospital Stay: Payer: BLUE CROSS/BLUE SHIELD

## 2018-03-03 VITALS — BP 126/77 | HR 76 | Temp 97.8°F | Resp 18

## 2018-03-03 DIAGNOSIS — D509 Iron deficiency anemia, unspecified: Secondary | ICD-10-CM | POA: Diagnosis not present

## 2018-03-03 MED ORDER — SODIUM CHLORIDE 0.9 % IV SOLN
Freq: Once | INTRAVENOUS | Status: AC
  Administered 2018-03-03: 11:00:00 via INTRAVENOUS
  Filled 2018-03-03: qty 250

## 2018-03-03 MED ORDER — IRON SUCROSE 20 MG/ML IV SOLN
200.0000 mg | Freq: Once | INTRAVENOUS | Status: AC
  Administered 2018-03-03: 200 mg via INTRAVENOUS
  Filled 2018-03-03: qty 10

## 2018-03-03 MED ORDER — SODIUM CHLORIDE 0.9 % IV SOLN: 200.0000 mg | Freq: Once | INTRAVENOUS | Status: DC

## 2018-03-10 ENCOUNTER — Inpatient Hospital Stay: Payer: BLUE CROSS/BLUE SHIELD

## 2018-03-10 VITALS — BP 151/88 | HR 85 | Temp 96.5°F | Resp 18

## 2018-03-10 DIAGNOSIS — D509 Iron deficiency anemia, unspecified: Secondary | ICD-10-CM

## 2018-03-10 MED ORDER — SODIUM CHLORIDE 0.9 % IV SOLN: 200.0000 mg | Freq: Once | INTRAVENOUS | Status: DC

## 2018-03-10 MED ORDER — IRON SUCROSE 20 MG/ML IV SOLN
200.0000 mg | Freq: Once | INTRAVENOUS | Status: AC
  Administered 2018-03-10: 200 mg via INTRAVENOUS
  Filled 2018-03-10: qty 10

## 2018-03-10 MED ORDER — SODIUM CHLORIDE 0.9 % IV SOLN
Freq: Once | INTRAVENOUS | Status: AC
  Administered 2018-03-10: 10:00:00 via INTRAVENOUS
  Filled 2018-03-10: qty 250

## 2018-03-10 NOTE — Patient Instructions (Signed)
Iron Sucrose injection What is this medicine? IRON SUCROSE (AHY ern SOO krohs) is an iron complex. Iron is used to make healthy red blood cells, which carry oxygen and nutrients throughout the body. This medicine is used to treat iron deficiency anemia in people with chronic kidney disease. This medicine may be used for other purposes; ask your health care provider or pharmacist if you have questions. COMMON BRAND NAME(S): Venofer What should I tell my health care provider before I take this medicine? They need to know if you have any of these conditions: -anemia not caused by low iron levels -heart disease -high levels of iron in the blood -kidney disease -liver disease -an unusual or allergic reaction to iron, other medicines, foods, dyes, or preservatives -pregnant or trying to get pregnant -breast-feeding How should I use this medicine? This medicine is for infusion into a vein. It is given by a health care professional in a hospital or clinic setting. Talk to your pediatrician regarding the use of this medicine in children. While this drug may be prescribed for children as young as 2 years for selected conditions, precautions do apply. Overdosage: If you think you have taken too much of this medicine contact a poison control center or emergency room at once. NOTE: This medicine is only for you. Do not share this medicine with others. What if I miss a dose? It is important not to miss your dose. Call your doctor or health care professional if you are unable to keep an appointment. What may interact with this medicine? Do not take this medicine with any of the following medications: -deferoxamine -dimercaprol -other iron products This medicine may also interact with the following medications: -chloramphenicol -deferasirox This list may not describe all possible interactions. Give your health care provider a list of all the medicines, herbs, non-prescription drugs, or dietary  supplements you use. Also tell them if you smoke, drink alcohol, or use illegal drugs. Some items may interact with your medicine. What should I watch for while using this medicine? Visit your doctor or healthcare professional regularly. Tell your doctor or healthcare professional if your symptoms do not start to get better or if they get worse. You may need blood work done while you are taking this medicine. You may need to follow a special diet. Talk to your doctor. Foods that contain iron include: whole grains/cereals, dried fruits, beans, or peas, leafy green vegetables, and organ meats (liver, kidney). What side effects may I notice from receiving this medicine? Side effects that you should report to your doctor or health care professional as soon as possible: -allergic reactions like skin rash, itching or hives, swelling of the face, lips, or tongue -breathing problems -changes in blood pressure -cough -fast, irregular heartbeat -feeling faint or lightheaded, falls -fever or chills -flushing, sweating, or hot feelings -joint or muscle aches/pains -seizures -swelling of the ankles or feet -unusually weak or tired Side effects that usually do not require medical attention (report to your doctor or health care professional if they continue or are bothersome): -diarrhea -feeling achy -headache -irritation at site where injected -nausea, vomiting -stomach upset -tiredness This list may not describe all possible side effects. Call your doctor for medical advice about side effects. You may report side effects to FDA at 1-800-FDA-1088. Where should I keep my medicine? This drug is given in a hospital or clinic and will not be stored at home. NOTE: This sheet is a summary. It may not cover all possible information. If   you have questions about this medicine, talk to your doctor, pharmacist, or health care provider.  2018 Elsevier/Gold Standard (2011-04-09 17:14:35)  

## 2018-04-06 ENCOUNTER — Other Ambulatory Visit: Payer: BLUE CROSS/BLUE SHIELD

## 2018-04-07 ENCOUNTER — Ambulatory Visit: Payer: BLUE CROSS/BLUE SHIELD | Admitting: Oncology

## 2018-04-07 ENCOUNTER — Ambulatory Visit: Payer: BLUE CROSS/BLUE SHIELD

## 2018-09-05 ENCOUNTER — Other Ambulatory Visit: Payer: Self-pay

## 2018-09-05 DIAGNOSIS — Z79899 Other long term (current) drug therapy: Secondary | ICD-10-CM | POA: Insufficient documentation

## 2018-09-05 DIAGNOSIS — I10 Essential (primary) hypertension: Secondary | ICD-10-CM | POA: Insufficient documentation

## 2018-09-05 DIAGNOSIS — N939 Abnormal uterine and vaginal bleeding, unspecified: Secondary | ICD-10-CM | POA: Diagnosis not present

## 2018-09-05 DIAGNOSIS — Z3202 Encounter for pregnancy test, result negative: Secondary | ICD-10-CM | POA: Diagnosis not present

## 2018-09-05 DIAGNOSIS — E119 Type 2 diabetes mellitus without complications: Secondary | ICD-10-CM | POA: Insufficient documentation

## 2018-09-05 LAB — BASIC METABOLIC PANEL
Anion gap: 6 (ref 5–15)
BUN: 10 mg/dL (ref 6–20)
CHLORIDE: 107 mmol/L (ref 98–111)
CO2: 24 mmol/L (ref 22–32)
Calcium: 9 mg/dL (ref 8.9–10.3)
Creatinine, Ser: 0.7 mg/dL (ref 0.44–1.00)
GFR calc Af Amer: >60 mL/min (ref >60)
GFR calc non Af Amer: >60 mL/min (ref >60)
Glucose, Bld: 170 mg/dL — ABNORMAL HIGH (ref 70–99)
Potassium: 3.7 mmol/L (ref 3.5–5.1)
Sodium: 137 mmol/L (ref 135–145)

## 2018-09-05 LAB — CBC
HCT: 37 % (ref 36.0–46.0)
Hemoglobin: 12 g/dL (ref 12.0–15.0)
MCH: 27.8 pg (ref 26.0–34.0)
MCHC: 32.4 g/dL (ref 30.0–36.0)
MCV: 85.8 fL (ref 80.0–100.0)
Platelets: 302 10*3/uL (ref 150–400)
RBC: 4.31 MIL/uL (ref 3.87–5.11)
RDW: 13.8 % (ref 11.5–15.5)
WBC: 10.6 10*3/uL — AB (ref 4.0–10.5)
nRBC: 0 % (ref 0.0–0.2)

## 2018-09-05 LAB — POCT PREGNANCY, URINE: Preg Test, Ur: NEGATIVE

## 2018-09-05 NOTE — ED Triage Notes (Signed)
Pt arrives to ED via POV from home with c/o vaginal bleeding since Feb 14th of this year with h/x of same May of 2019. Pt reports passing large clots with "normal red blood". Pt denies any c/o's N/V/D, no fever, dizziness or near-syncope. Pt reports receiving an iron infusion in Aug of 2019 for same s/x's. Pt is A&O, in NAD; RR even, regular, and unlabored.

## 2018-09-06 ENCOUNTER — Emergency Department: Payer: BLUE CROSS/BLUE SHIELD

## 2018-09-06 ENCOUNTER — Emergency Department
Admission: EM | Admit: 2018-09-06 | Discharge: 2018-09-06 | Disposition: A | Discharge status: Home / Self Care | Payer: BLUE CROSS/BLUE SHIELD | Attending: Emergency Medicine | Admitting: Emergency Medicine

## 2018-09-06 DIAGNOSIS — N939 Abnormal uterine and vaginal bleeding, unspecified: Secondary | ICD-10-CM

## 2018-09-06 NOTE — ED Notes (Signed)
Pt states she has uterine fybriod that is bleeding, states the bleeding is intermittent and does not soak pad in an hour.

## 2018-09-06 NOTE — ED Provider Notes (Signed)
[facility] Emergency Department Provider Note   ____________________________________________   First MD Initiated Contact with Patient 09/06/18 [id number]     (approximate)  I have reviewed the triage vital signs and the nursing notes.   HISTORY  Chief Complaint Vaginal Bleeding    HPI Karla Hunt is a 58 y.o. female who sees Dr. Leafy Ro.  She had an episode of heavy vaginal bleeding which resolved after 3 doses of progesterone.  She reports that she had a high estrogen level and her endometrium was very thick.  She is having heavier vaginal bleeding again.  Comes into the emergency room for evaluation.   Past Medical History:  Diagnosis Date  . Diabetes mellitus without complication ([place])   . Hypertension   . Menopause   . Thyroid disease    hyperthyroidism    Patient Active Problem List   Diagnosis Date Noted  . Iron deficiency anemia 02/10/2018    Past Surgical History:  Procedure Laterality Date  . CHOLECYSTECTOMY      Prior to Admission medications   Medication Sig Start Date End Date Taking? Authorizing Provider  Cholecalciferol (VITAMIN D3) 2000 units TABS Take 2,000 Units by mouth daily.    [provider]  Choline Fenofibrate (FENOFIBRIC ACID) 135 MG CPDR TK 1 C PO QD 07/27/16   [provider]  levothyroxine (SYNTHROID, LEVOTHROID) 150 MCG tablet TK 1 T PO QAM AC OES 08/13/16   [provider]  lisinopril (PRINIVIL,ZESTRIL) 10 MG tablet TK 1 T PO D IN THE MORNING FOR BP 01/25/18   [provider]  loratadine (CLARITIN) 10 MG tablet TK 1 T PO D 02/02/18   [provider]  losartan (COZAAR) 25 MG tablet TK 1 T PO D IN THE MORNING 02/02/18   [provider]  MEDROXYPROGESTERONE ACETATE PO Take 10 mg by mouth daily as needed. 12/01/17   [provider]  metFORMIN (GLUCOPHAGE) 500 MG tablet TK 1 T PO BID 07/27/16   [provider]  propranolol (INDERAL) 20 MG tablet Take 20 mg  by mouth daily as needed. 01/15/18   [provider]  vitamin B-12 (CYANOCOBALAMIN) 500 MCG tablet Take 500 mcg by mouth every other day.    [provider]    Allergies Penicillin g  Family History  Problem Relation Age of Onset  . Hypertension Mother   . Heart attack Father   . Heart failure Father     Social History Social History   Tobacco Use  . Smoking status: Never Smoker  . Smokeless tobacco: Never Used  Substance Use Topics  . Alcohol use: Never    Frequency: Never  . Drug use: Never    Review of Systems  Constitutional: No fever/chills Eyes: No visual changes. ENT: No sore throat. Cardiovascular: Denies chest pain. Respiratory: Denies shortness of breath. Gastrointestinal: No abdominal pain.  No nausea, no vomiting.  No diarrhea.  No constipation. Genitourinary: Negative for dysuria. Musculoskeletal: Negative for back pain. Skin: Negative for rash. Neurological: Negative for headaches, focal weakness   ____________________________________________   PHYSICAL EXAM:  VITAL SIGNS: ED Triage Vitals [09/05/18 2052]  Enc Vitals Group     BP (!) 157/96     Pulse Rate (!) 113     Resp 17     Temp 99.2 F (37.3 C)     Temp Source Oral     SpO2 100 %     Weight 162 lb (73.5 kg)     Height 5'  4" (1.626 m)     Head Circumference      Peak Flow      Pain Score 1     Pain Loc      Pain Edu?      Excl. in [place]?     Constitutional: Alert and oriented. Well appearing and in no acute distress. Eyes: Conjunctivae are normal.  Head: Atraumatic. Nose: No congestion/rhinnorhea. Mouth/Throat: Mucous membranes are moist.  Oropharynx non-erythematous. Neck: No stridor.  Cardiovascular: Normal rate, regular rhythm. Grossly normal heart sounds.  Good peripheral circulation. Respiratory: Normal respiratory effort.  No retractions. Lungs CTAB. Gastrointestinal: Soft and nontender. No distention. No abdominal pain or tenderness except for very minimal  in the left lower quadrant GYN: Normal perineum normal vagina cervix is somewhat blue in color the osseous fingertip there is a small amount of dark blood coming out.  Musculoskeletal: No lower extremity tenderness nor edema.   Neurologic:  Normal speech and language. No gross focal neurologic deficits are appreciated.  Skin:  Skin is warm, dry and intact. No rash noted. Psychiatric: Mood and affect are normal. Speech and behavior are normal.  ____________________________________________   LABS (all labs ordered are listed, but only abnormal results are displayed)  Labs Reviewed  BASIC METABOLIC PANEL - Abnormal; Notable for the following components:      Result Value   Glucose, Bld 170 (*)    All other components within normal limits  CBC - Abnormal; Notable for the following components:   WBC 10.6 (*)    All other components within normal limits  URINALYSIS, COMPLETE (UACMP) WITH MICROSCOPIC  POC URINE PREG, ED  POCT PREGNANCY, URINE   ____________________________________________  EKG   ____________________________________________  RADIOLOGY  ED MD interpretation: Ultrasound shows heterogeneous uterus with a fibroid.  They do not visualize the endometrium well.  Official radiology report(s): US Transvaginal Non-ob  Result Date: 09/06/2018 CLINICAL DATA:  58 year old female with heavy vaginal bleeding times 10 days. Patient is perimenopausal. LMP: 08/26/2018 EXAM: TRANSABDOMINAL AND TRANSVAGINAL ULTRASOUND OF PELVIS TECHNIQUE: Both transabdominal and transvaginal ultrasound examinations of the pelvis were performed. Transabdominal technique was performed for global imaging of the pelvis including uterus, ovaries, adnexal regions, and pelvic cul-de-sac. It was necessary to proceed with endovaginal exam following the transabdominal exam to visualize the endometrium and ovaries. COMPARISON:  None FINDINGS: Uterus Measurements: 10.6 x 6.0 x 6.1 cm = volume: 202.5 mL. The uterus is  anteverted and heterogeneous. There is a 10.2 x 6.8 x 7.0 cm posterior body fibroid. Endometrium Thickness: 7 mm. The endometrium is poorly visualized and suboptimally evaluated. Right ovary Measurements: Not visualized Left ovary Measurements: 2.6 x 1.8 x 2.0 cm = volume: 4.8 mL. There is a physiologic follicle/cyst in the left ovary. Other findings No abnormal free fluid. IMPRESSION: 1. Heterogeneous uterus with a dominant fibroid. 2. Suboptimal evaluation of the endometrium. 3. Unremarkable left ovary.  Nonvisualization of the right ovary. Electronically Signed   By: Anner Crete M.D.   On: 09/06/2018 02:45   US Pelvis Complete  Result Date: 09/06/2018 CLINICAL DATA:  58 year old female with heavy vaginal bleeding times 10 days. Patient is perimenopausal. LMP: 08/26/2018 EXAM: TRANSABDOMINAL AND TRANSVAGINAL ULTRASOUND OF PELVIS TECHNIQUE: Both transabdominal and transvaginal ultrasound examinations of the pelvis were performed. Transabdominal technique was performed for global imaging of the pelvis including uterus, ovaries, adnexal regions, and pelvic cul-de-sac. It was necessary to proceed with endovaginal exam following the transabdominal exam to visualize the endometrium and ovaries. COMPARISON:  None FINDINGS:  Uterus Measurements: 10.6 x 6.0 x 6.1 cm = volume: 202.5 mL. The uterus is anteverted and heterogeneous. There is a 10.2 x 6.8 x 7.0 cm posterior body fibroid. Endometrium Thickness: 7 mm. The endometrium is poorly visualized and suboptimally evaluated. Right ovary Measurements: Not visualized Left ovary Measurements: 2.6 x 1.8 x 2.0 cm = volume: 4.8 mL. There is a physiologic follicle/cyst in the left ovary. Other findings No abnormal free fluid. IMPRESSION: 1. Heterogeneous uterus with a dominant fibroid. 2. Suboptimal evaluation of the endometrium. 3. Unremarkable left ovary.  Nonvisualization of the right ovary. Electronically Signed   By: Anner Crete M.D.   On: 09/06/2018 02:45     ____________________________________________   PROCEDURES  Procedure(s) performed (including Critical Care):  Procedures   ____________________________________________   INITIAL IMPRESSION / ASSESSMENT AND PLAN / ED COURSE  Discussed with Dr. Leonides Schanz OB/GYN.  We will do 10 of progesterone once a day and follow-up later this week.         ____________________________________________   FINAL CLINICAL IMPRESSION(S) / ED DIAGNOSES  Final diagnoses:  Vaginal bleeding     ED Discharge Orders    None       Note:  This document was prepared using Dragon voice recognition software and may include unintentional dictation errors.    Nena Polio, MD 09/06/18 [phone]

## 2018-09-06 NOTE — Discharge Instructions (Addendum)
I discussed your case with Dr. Leonides Schanz.  We will try progesterone 10 mg once a day.  She wants to see you in the office later this week.  Please call them and make the appointment for follow-up.  Let them know that the ER doctor discussed your case with Dr. Leonides Schanz and she wanted t you to be seen this coming week.  Please note the patient has been in the ER for well over 6 hours at this point.  She needs to be here for slightly longer.  She will be in no shape to fill any kind of duties as a member of a jury.  At this point it would be unsafe for her to attempt to drive anywhere.  She will need at least 8 hours of sleep.

## 2018-09-13 ENCOUNTER — Encounter
Admission: RE | Admit: 2018-09-13 | Discharge: 2018-09-13 | Disposition: A | Discharge status: Home / Self Care | Payer: BLUE CROSS/BLUE SHIELD | Source: Ambulatory Visit | Attending: Obstetrics & Gynecology | Admitting: Obstetrics & Gynecology

## 2018-09-13 ENCOUNTER — Other Ambulatory Visit: Payer: Self-pay

## 2018-09-13 HISTORY — DX: Hypothyroidism, unspecified: E03.9

## 2018-09-13 HISTORY — DX: Cardiac murmur, unspecified: R01.1

## 2018-09-13 HISTORY — DX: Personal history of urinary calculi: Z87.442

## 2018-09-13 HISTORY — DX: Anxiety disorder, unspecified: F41.9

## 2018-09-13 NOTE — Patient Instructions (Signed)
Your procedure is scheduled on: 09-14-18 [facility] The Report to Same Day Surgery 2nd floor medical mall [facility] Entrance-take elevator on left to 2nd floor.  Check in with surgery information desk.) To find out your arrival time please call [phone] between 1PM - 3PM on 09-13-18 TUESDAY  Remember: Instructions that are not followed completely may result in serious medical risk, up to and including death, or upon the discretion of your surgeon and anesthesiologist your surgery may need to be rescheduled.    _x___ 1. Do not eat food after midnight the night before your procedure. NO GUM OR CANDY AFTER MIDNIGHT.  You may drink WATER up to 2 hours before you are scheduled to arrive at the hospital for your procedure.  Do not drink WATER within 2 hours of your scheduled arrival to the hospital.  Type 1 and type 2 diabetics should only drink water.   ____Ensure clear carbohydrate drink on the way to the hospital for bariatric patients  ____Ensure clear carbohydrate drink 3 hours before surgery for Dr Dwyane Luo patients if physician instructed.     __x__ 2. No Alcohol for 24 hours before or after surgery.   __x__3. No Smoking or e-cigarettes for 24 prior to surgery.  Do not use any chewable tobacco products for at least 6 hour prior to surgery   ____  4. Bring all medications with you on the day of surgery if instructed.    __x__ 5. Notify your doctor if there is any change in your medical condition     (cold, fever, infections).    x___6. On the morning of surgery brush your teeth with toothpaste and water.  You may rinse your mouth with mouth wash if you wish.  Do not swallow any toothpaste or mouthwash.   Do not wear jewelry, make-up, hairpins, clips or nail polish.  Do not wear lotions, powders, or perfumes. You may wear deodorant.  Do not shave 48 hours prior to surgery. Men may shave face and neck.  Do not bring valuables to the hospital.    [facility] is not responsible for any  belongings or valuables.               Contacts, dentures or bridgework may not be worn into surgery.  Leave your suitcase in the car. After surgery it may be brought to your room.  For patients admitted to the hospital, discharge time is determined by your treatment team.  _  Patients discharged the day of surgery will not be allowed to drive home.  You will need someone to drive you home and stay with you the night of your procedure.    Please read over the following fact sheets that you were given:   [facility] Preparing for Surgery  _x___ TAKE THE FOLLOWING MEDICATION THE MORNING OF SURGERY WITH A SMALL SIP OF WATER. These include:  1. ALLEGRA   2. LEVOTHYROXINE  3. CRESTOR   4. PROPRANOLOL  5.  6.  ____Fleets enema or Magnesium Citrate as directed.   ____ Use CHG Soap or sage wipes as directed on instruction sheet   ____ Use inhalers on the day of surgery and bring to hospital day of surgery  _X___ Stop Metformin  2 days prior to surgery-STOP NOW-LAST DOSE WAS 09-13-18 IN AM    ____ Take 1/2 of usual insulin dose the night before surgery and none on the morning surgery.   ____ Follow recommendations from Cardiologist, Pulmonologist or PCP regarding  stopping Aspirin,  Coumadin, Plavix ,Eliquis, Effient, or Pradaxa, and Pletal.  X____Stop Anti-inflammatories such as Advil, Aleve, Ibuprofen, Motrin, Naproxen, Naprosyn, Goodies powders or aspirin products NOW- OK to take Tylenol    ___ Stop supplements until after surgery.    ____ Bring C-Pap to the hospital.

## 2018-09-14 ENCOUNTER — Ambulatory Visit: Payer: BLUE CROSS/BLUE SHIELD | Admitting: Anesthesiology

## 2018-09-14 ENCOUNTER — Encounter: Payer: Self-pay | Admitting: *Deleted

## 2018-09-14 ENCOUNTER — Encounter
Admission: RE | Disposition: A | Discharge status: Home / Self Care | Payer: Self-pay | Source: Home / Self Care | Attending: Obstetrics & Gynecology

## 2018-09-14 ENCOUNTER — Ambulatory Visit
Admission: RE | Admit: 2018-09-14 | Discharge: 2018-09-14 | Disposition: A | Discharge status: Home / Self Care | Payer: BLUE CROSS/BLUE SHIELD | Attending: Obstetrics & Gynecology | Admitting: Obstetrics & Gynecology

## 2018-09-14 ENCOUNTER — Other Ambulatory Visit: Payer: Self-pay

## 2018-09-14 DIAGNOSIS — F419 Anxiety disorder, unspecified: Secondary | ICD-10-CM | POA: Diagnosis not present

## 2018-09-14 DIAGNOSIS — Z880 Allergy status to penicillin: Secondary | ICD-10-CM | POA: Diagnosis not present

## 2018-09-14 DIAGNOSIS — E039 Hypothyroidism, unspecified: Secondary | ICD-10-CM | POA: Diagnosis not present

## 2018-09-14 DIAGNOSIS — I10 Essential (primary) hypertension: Secondary | ICD-10-CM | POA: Diagnosis not present

## 2018-09-14 DIAGNOSIS — Z7984 Long term (current) use of oral hypoglycemic drugs: Secondary | ICD-10-CM | POA: Insufficient documentation

## 2018-09-14 DIAGNOSIS — Z888 Allergy status to other drugs, medicaments and biological substances status: Secondary | ICD-10-CM | POA: Insufficient documentation

## 2018-09-14 DIAGNOSIS — D251 Intramural leiomyoma of uterus: Secondary | ICD-10-CM | POA: Insufficient documentation

## 2018-09-14 DIAGNOSIS — E119 Type 2 diabetes mellitus without complications: Secondary | ICD-10-CM | POA: Diagnosis not present

## 2018-09-14 DIAGNOSIS — N939 Abnormal uterine and vaginal bleeding, unspecified: Secondary | ICD-10-CM | POA: Diagnosis present

## 2018-09-14 DIAGNOSIS — D259 Leiomyoma of uterus, unspecified: Secondary | ICD-10-CM | POA: Diagnosis present

## 2018-09-14 DIAGNOSIS — Z79890 Hormone replacement therapy (postmenopausal): Secondary | ICD-10-CM | POA: Diagnosis not present

## 2018-09-14 DIAGNOSIS — N840 Polyp of corpus uteri: Secondary | ICD-10-CM | POA: Insufficient documentation

## 2018-09-14 HISTORY — DX: Failed or difficult intubation, initial encounter: T88.4XXA

## 2018-09-14 HISTORY — PX: HYSTEROSCOPY WITH D & C: SHX1775

## 2018-09-14 HISTORY — PX: VAGINAL HYSTERECTOMY: SHX2639

## 2018-09-14 LAB — POCT PREGNANCY, URINE: Preg Test, Ur: NEGATIVE

## 2018-09-14 LAB — PREPARE RBC (CROSSMATCH)

## 2018-09-14 LAB — GLUCOSE, CAPILLARY
Glucose-Capillary: 118 mg/dL — ABNORMAL HIGH (ref 70–99)
Glucose-Capillary: 124 mg/dL — ABNORMAL HIGH (ref 70–99)

## 2018-09-14 LAB — ABO/RH: ABO/RH(D): O POS

## 2018-09-14 SURGERY — DILATATION AND CURETTAGE /HYSTEROSCOPY
Anesthesia: General

## 2018-09-14 MED ORDER — LIDOCAINE-EPINEPHRINE 1 %-1:100000 IJ SOLN
INTRAMUSCULAR | Status: DC | PRN
  Administered 2018-09-14: 20 mL

## 2018-09-14 MED ORDER — FENTANYL CITRATE (PF) 100 MCG/2ML IJ SOLN
INTRAMUSCULAR | Status: AC
  Filled 2018-09-14: qty 2

## 2018-09-14 MED ORDER — FAMOTIDINE 20 MG PO TABS
20.0000 mg | ORAL_TABLET | Freq: Once | ORAL | Status: AC
  Administered 2018-09-14: 20 mg via ORAL

## 2018-09-14 MED ORDER — BUPIVACAINE LIPOSOME 1.3 % IJ SUSP
INTRAMUSCULAR | Status: AC
  Filled 2018-09-14: qty 20

## 2018-09-14 MED ORDER — PROPOFOL 10 MG/ML IV BOLUS
INTRAVENOUS | Status: AC
  Filled 2018-09-14: qty 20

## 2018-09-14 MED ORDER — DEXAMETHASONE SODIUM PHOSPHATE 10 MG/ML IJ SOLN
INTRAMUSCULAR | Status: DC | PRN
  Administered 2018-09-14: 20 mg via INTRAVENOUS

## 2018-09-14 MED ORDER — KETOROLAC TROMETHAMINE 30 MG/ML IJ SOLN
30.0000 mg | Freq: Four times a day (QID) | INTRAMUSCULAR | Status: DC
  Filled 2018-09-14: qty 1

## 2018-09-14 MED ORDER — LIDOCAINE-EPINEPHRINE 1 %-1:100000 IJ SOLN
INTRAMUSCULAR | Status: AC
  Filled 2018-09-14: qty 1

## 2018-09-14 MED ORDER — LACTATED RINGERS IV SOLN
INTRAVENOUS | Status: DC | PRN
  Administered 2018-09-14: 08:00:00 via INTRAVENOUS

## 2018-09-14 MED ORDER — OXYCODONE HCL 5 MG PO TABS: 5.0000 mg | ORAL_TABLET | ORAL | 0 refills | Status: AC | PRN

## 2018-09-14 MED ORDER — ACETAMINOPHEN 325 MG PO TABS: 650.0000 mg | ORAL_TABLET | ORAL | Status: DC | PRN

## 2018-09-14 MED ORDER — FENTANYL CITRATE (PF) 100 MCG/2ML IJ SOLN
INTRAMUSCULAR | Status: DC | PRN
  Administered 2018-09-14: 25 ug via INTRAVENOUS
  Administered 2018-09-14 (×2): 50 ug via INTRAVENOUS
  Administered 2018-09-14: 25 ug via INTRAVENOUS
  Administered 2018-09-14: 50 ug via INTRAVENOUS

## 2018-09-14 MED ORDER — METHYLENE BLUE 0.5 % INJ SOLN
INTRAVENOUS | Status: AC
  Filled 2018-09-14: qty 10

## 2018-09-14 MED ORDER — PROPOFOL 10 MG/ML IV BOLUS
INTRAVENOUS | Status: DC | PRN
  Administered 2018-09-14: 150 mg via INTRAVENOUS

## 2018-09-14 MED ORDER — STERILE WATER FOR IRRIGATION IR SOLN: Status: DC | PRN

## 2018-09-14 MED ORDER — GLYCOPYRROLATE 0.2 MG/ML IJ SOLN
INTRAMUSCULAR | Status: DC | PRN
  Administered 2018-09-14: .2 mg via INTRAVENOUS

## 2018-09-14 MED ORDER — SUCCINYLCHOLINE CHLORIDE 20 MG/ML IJ SOLN
INTRAMUSCULAR | Status: AC
  Filled 2018-09-14: qty 1

## 2018-09-14 MED ORDER — SUCCINYLCHOLINE CHLORIDE 20 MG/ML IJ SOLN
INTRAMUSCULAR | Status: DC | PRN
  Administered 2018-09-14: 80 mg via INTRAVENOUS
  Administered 2018-09-14: 100 mg via INTRAVENOUS

## 2018-09-14 MED ORDER — MIDAZOLAM HCL 2 MG/2ML IJ SOLN
INTRAMUSCULAR | Status: DC | PRN
  Administered 2018-09-14: 2 mg via INTRAVENOUS

## 2018-09-14 MED ORDER — BUPIVACAINE LIPOSOME 1.3 % IJ SUSP
INTRAMUSCULAR | Status: DC | PRN
  Administered 2018-09-14: 20 mL

## 2018-09-14 MED ORDER — LACTATED RINGERS IV SOLN: INTRAVENOUS | Status: DC

## 2018-09-14 MED ORDER — ROCURONIUM BROMIDE 50 MG/5ML IV SOLN
INTRAVENOUS | Status: AC
  Filled 2018-09-14: qty 1

## 2018-09-14 MED ORDER — FENTANYL CITRATE (PF) 100 MCG/2ML IJ SOLN
25.0000 ug | INTRAMUSCULAR | Status: DC | PRN
  Administered 2018-09-14 (×2): 25 ug via INTRAVENOUS

## 2018-09-14 MED ORDER — LIDOCAINE HCL (CARDIAC) PF 100 MG/5ML IV SOSY: PREFILLED_SYRINGE | INTRAVENOUS | Status: DC | PRN

## 2018-09-14 MED ORDER — LIDOCAINE HCL (CARDIAC) PF 100 MG/5ML IV SOSY
PREFILLED_SYRINGE | INTRAVENOUS | Status: DC | PRN
  Administered 2018-09-14: 60 mg via INTRAVENOUS

## 2018-09-14 MED ORDER — SODIUM CHLORIDE 0.9 % IV SOLN
INTRAVENOUS | Status: DC
  Administered 2018-09-14 (×2): via INTRAVENOUS

## 2018-09-14 MED ORDER — EPHEDRINE SULFATE 50 MG/ML IJ SOLN
INTRAMUSCULAR | Status: AC
  Filled 2018-09-14: qty 1

## 2018-09-14 MED ORDER — MIDAZOLAM HCL 2 MG/2ML IJ SOLN
INTRAMUSCULAR | Status: AC
  Filled 2018-09-14: qty 2

## 2018-09-14 MED ORDER — CEFAZOLIN SODIUM-DEXTROSE 2-3 GM-%(50ML) IV SOLR
INTRAVENOUS | Status: DC | PRN
  Administered 2018-09-14: 2 g via INTRAVENOUS

## 2018-09-14 MED ORDER — MORPHINE SULFATE (PF) 4 MG/ML IV SOLN: 1.0000 mg | INTRAVENOUS | Status: DC | PRN

## 2018-09-14 MED ORDER — ACETAMINOPHEN 650 MG RE SUPP
650.0000 mg | RECTAL | Status: DC | PRN
  Filled 2018-09-14: qty 1

## 2018-09-14 MED ORDER — CEFAZOLIN SODIUM-DEXTROSE 2-3 GM-%(50ML) IV SOLR: INTRAVENOUS | Status: DC | PRN

## 2018-09-14 MED ORDER — ONDANSETRON HCL 4 MG/2ML IJ SOLN: 4.0000 mg | Freq: Once | INTRAMUSCULAR | Status: DC | PRN

## 2018-09-14 MED ORDER — MENTHOL 3 MG MT LOZG: 1.0000 | LOZENGE | OROMUCOSAL | Status: DC | PRN

## 2018-09-14 MED ORDER — FAMOTIDINE 20 MG PO TABS
ORAL_TABLET | ORAL | Status: AC
  Administered 2018-09-14: 20 mg via ORAL
  Filled 2018-09-14: qty 1

## 2018-09-14 MED ORDER — FENTANYL CITRATE (PF) 100 MCG/2ML IJ SOLN
INTRAMUSCULAR | Status: AC
  Administered 2018-09-14: 25 ug via INTRAVENOUS
  Filled 2018-09-14: qty 2

## 2018-09-14 MED ORDER — ONDANSETRON HCL 4 MG/2ML IJ SOLN
INTRAMUSCULAR | Status: DC | PRN
  Administered 2018-09-14: 4 mg via INTRAVENOUS

## 2018-09-14 MED ORDER — IBUPROFEN 800 MG PO TABS: 800.0000 mg | ORAL_TABLET | Freq: Four times a day (QID) | ORAL | 0 refills | Status: AC

## 2018-09-14 SURGICAL SUPPLY — 37 items
BAG INFUSER PRESSURE 100CC (MISCELLANEOUS) IMPLANT
BLADE SURG SZ10 CARB STEEL (BLADE) ×6 IMPLANT
CNTNR SPEC 2.5X3XGRAD LEK (MISCELLANEOUS) ×1
CONT SPEC 4OZ STER OR WHT (MISCELLANEOUS) ×2
CONTAINER SPEC 2.5X3XGRAD LEK (MISCELLANEOUS) ×1 IMPLANT
COVER WAND RF STERILE (DRAPES) ×3 IMPLANT
ELECT REM PT RETURN 9FT ADLT (ELECTROSURGICAL) ×3
ELECTRODE REM PT RTRN 9FT ADLT (ELECTROSURGICAL) ×1 IMPLANT
GAUZE 4X4 16PLY RFD (DISPOSABLE) ×3 IMPLANT
GLOVE PI ORTHOPRO 6.5 (GLOVE) ×2
GLOVE PI ORTHOPRO STRL 6.5 (GLOVE) ×1 IMPLANT
GLOVE SURG SYN 6.5 ES PF (GLOVE) ×3 IMPLANT
GOWN STRL REUS W/ TWL LRG LVL3 (GOWN DISPOSABLE) ×2 IMPLANT
GOWN STRL REUS W/TWL LRG LVL3 (GOWN DISPOSABLE) ×4
GRADUATE 1200CC STRL 31836 (MISCELLANEOUS) ×3 IMPLANT
HANDLE YANKAUER SUCT BULB TIP (MISCELLANEOUS) ×3 IMPLANT
IV NS 1000ML (IV SOLUTION)
IV NS 1000ML BAXH (IV SOLUTION) IMPLANT
KIT BLADEGUARD II DBL (SET/KITS/TRAYS/PACK) ×3 IMPLANT
KIT PROCEDURE FLUENT (KITS) IMPLANT
KIT TURNOVER CYSTO (KITS) ×3 IMPLANT
NEEDLE SPNL 22GX3.5 QUINCKE BK (NEEDLE) ×3 IMPLANT
PACK DNC HYST (MISCELLANEOUS) ×3 IMPLANT
PAD OB MATERNITY 4.3X12.25 (PERSONAL CARE ITEMS) ×3 IMPLANT
PAD PREP 24X41 OB/GYN DISP (PERSONAL CARE ITEMS) ×3 IMPLANT
PENCIL ELECTRO HAND CTR (MISCELLANEOUS) ×3 IMPLANT
SOL .9 NS 3000ML IRR  AL (IV SOLUTION)
SOL .9 NS 3000ML IRR UROMATIC (IV SOLUTION) IMPLANT
SUT VIC AB 0 CT1 27 (SUTURE) ×12
SUT VIC AB 0 CT1 27XCR 8 STRN (SUTURE) ×6 IMPLANT
SUT VIC AB 2-0 CT1 27 (SUTURE) ×2
SUT VIC AB 2-0 CT1 TAPERPNT 27 (SUTURE) ×1 IMPLANT
SUT VIC AB 4-0 PS2 18 (SUTURE) ×6 IMPLANT
SYR 10ML LL (SYRINGE) ×6 IMPLANT
SYRINGE IRR TOOMEY STRL 70CC (SYRINGE) ×3 IMPLANT
TUBING CONNECTING 10 (TUBING) ×2 IMPLANT
TUBING CONNECTING 10' (TUBING) ×1

## 2018-09-14 NOTE — Discharge Instructions (Signed)
Discharge instructions:  Call office if you have any of the following: fever >101 F, chills, shortness of breath, excessive vaginal bleeding, incision drainage or problems, leg pain or redness, or any other concerns.   Activity: Do not lift > 10 lbs for 8 weeks.  No intercourse or tampons for 8 weeks.  No driving for 1-2 weeks.   Take 800mg  Ibuprofen and 1000mg  Tylenol around the clock, every 6 hours for at least the first 3-5 days.  After this you can take as needed.  This will help decrease inflammation and promote healing.  The narcotics you'll take just as needed, as they just trick your brain into thinking its not in pain.    Please don't limit yourself in terms of routine activity.  You will be able to do most things, although they may take longer to do or be a little painful.  You can do it!   General Anesthesia, Adult, Care After This sheet gives you information about how to care for yourself after your procedure. Your health care provider may also give you more specific instructions. If you have problems or questions, contact your health care provider. What can I expect after the procedure? After the procedure, the following side effects are common:  Pain or discomfort at the IV site.  Nausea.  Vomiting.  Sore throat.  Trouble concentrating.  Feeling cold or chills.  Weak or tired.  Sleepiness and fatigue.  Soreness and body aches. These side effects can affect parts of the body that were not involved in surgery. Follow these instructions at home:  For at least 24 hours after the procedure:  Have a responsible adult stay with you. It is important to have someone help care for you until you are awake and alert.  Rest as needed.  Do not: ? Participate in activities in which you could fall or become injured. ? Drive. ? Use heavy machinery. ? Drink alcohol. ? Take sleeping pills or medicines that cause drowsiness. ? Make important decisions or sign legal  documents. ? Take care of children on your own. Eating and drinking  Follow any instructions from your health care provider about eating or drinking restrictions.  When you feel hungry, start by eating small amounts of foods that are soft and easy to digest (bland), such as toast. Gradually return to your regular diet.  Drink enough fluid to keep your urine pale yellow.  If you vomit, rehydrate by drinking water, juice, or clear broth. General instructions  If you have sleep apnea, surgery and certain medicines can increase your risk for breathing problems. Follow instructions from your health care provider about wearing your sleep device: ? Anytime you are sleeping, including during daytime naps. ? While taking prescription pain medicines, sleeping medicines, or medicines that make you drowsy.  Return to your normal activities as told by your health care provider. Ask your health care provider what activities are safe for you.  Take over-the-counter and prescription medicines only as told by your health care provider.  If you smoke, do not smoke without supervision.  Keep all follow-up visits as told by your health care provider. This is important. Contact a health care provider if:  You have nausea or vomiting that does not get better with medicine.  You cannot eat or drink without vomiting.  You have pain that does not get better with medicine.  You are unable to pass urine.  You develop a skin rash.  You have a fever.  You  have redness around your IV site that gets worse. Get help right away if:  You have difficulty breathing.  You have chest pain.  You have blood in your urine or stool, or you vomit blood. Summary  After the procedure, it is common to have a sore throat or nausea. It is also common to feel tired.  Have a responsible adult stay with you for the first 24 hours after general anesthesia. It is important to have someone help care for you until you  are awake and alert.  When you feel hungry, start by eating small amounts of foods that are soft and easy to digest (bland), such as toast. Gradually return to your regular diet.  Drink enough fluid to keep your urine pale yellow.  Return to your normal activities as told by your health care provider. Ask your health care provider what activities are safe for you. This information is not intended to replace advice given to you by your health care provider. Make sure you discuss any questions you have with your health care provider. Document Released: 10/05/2000 Document Revised: 02/12/2017 Document Reviewed: 02/12/2017 Elsevier Interactive Patient Education  2019 [place] be a hero, but don't be a wimp either!

## 2018-09-14 NOTE — Anesthesia Procedure Notes (Addendum)
Procedure Name: LMA Insertion Date/Time: 09/14/2018 7:48 AM Performed by: Allean Found, CRNA Pre-anesthesia Checklist: Patient identified, Patient being monitored, Timeout performed, Emergency Drugs available and Suction available Patient Re-evaluated:Patient Re-evaluated prior to induction Oxygen Delivery Method: Circle system utilized Preoxygenation: Pre-oxygenation with 100% oxygen Induction Type: IV induction Ventilation: Mask ventilation without difficulty LMA: LMA inserted LMA Size: 4.0 Tube type: Oral Number of attempts: 1 Placement Confirmation: positive ETCO2 and breath sounds checked- equal and bilateral Tube secured with: Tape Dental Injury: Teeth and Oropharynx as per pre-operative assessment  Difficulty Due To: Difficulty was anticipated Future Recommendations: Recommend- induction with short-acting agent, and alternative techniques readily available

## 2018-09-14 NOTE — Anesthesia Post-op Follow-up Note (Signed)
Anesthesia QCDR form completed.        

## 2018-09-14 NOTE — Anesthesia Procedure Notes (Addendum)
Procedure Name: Intubation Date/Time: 09/14/2018 8:30 AM Performed by: Allean Found, CRNA Pre-anesthesia Checklist: Patient identified, Patient being monitored, Timeout performed, Emergency Drugs available and Suction available Patient Re-evaluated:Patient Re-evaluated prior to induction Oxygen Delivery Method: Circle system utilized Preoxygenation: Pre-oxygenation with 100% oxygen Induction Type: IV induction Ventilation: Mask ventilation without difficulty and Oral airway inserted - appropriate to patient size Laryngoscope Size: 3, McGraph and Glidescope Grade View: Grade IV Tube type: Oral Tube size: 7.0 mm Number of attempts: 1 Airway Equipment and Method: Stylet Placement Confirmation: ETT inserted through vocal cords under direct vision,  positive ETCO2 and breath sounds checked- equal and bilateral Secured at: 21 cm Tube secured with: Tape Dental Injury: Teeth and Oropharynx as per pre-operative assessment  Comments: Patient bleeding , decision made for vaginal hyst, decision made to intubate.  Attempted McGrath by myself and Dr Kayleen Memos, no success. Called for glidescope and Bougie.  DL with Glidescope unsuccessful.  Very difficult view. Could not direct ETT into glottis.  Went back to number 4 LMA.

## 2018-09-14 NOTE — H&P (Signed)
Preoperative History and Physical  Cristian Rogel is a 58 y.o. abnormal uterine bleeding. No significant preoperative concerns.  Proposed surgery: D&C hysteroscopy  Past Medical History:  Diagnosis Date  . Anxiety   . Diabetes mellitus without complication ([place])   . Heart murmur    asymptomatic  . History of kidney stones 2001  . Hypertension   . Hypothyroidism   . Menopause   . Thyroid disease    hyperthyroidism   Past Surgical History:  Procedure Laterality Date  . CHOLECYSTECTOMY     OB History  No obstetric history on file.  Patient denies any other pertinent gynecologic issues.   No current facility-administered medications on file prior to encounter.    Current Outpatient Medications on File Prior to Encounter  Medication Sig Dispense Refill  . acetaminophen (TYLENOL 8 HOUR ARTHRITIS PAIN) 650 MG CR tablet Take 1,300 mg by mouth every 8 (eight) hours as needed for pain.    . Cholecalciferol (VITAMIN D3) 2000 units TABS Take 2,000 Units by mouth daily.    . fexofenadine (ALLEGRA) 180 MG tablet Take 180 mg by mouth every morning.     Marland Kitchen levothyroxine (SYNTHROID, LEVOTHROID) 150 MCG tablet Take 150 mcg by mouth daily before breakfast.     . losartan (COZAAR) 25 MG tablet Take 25 mg by mouth every morning.   1  . medroxyPROGESTERone (PROVERA) 10 MG tablet Take 10 mg by mouth daily.    . metFORMIN (GLUCOPHAGE) 500 MG tablet Take 500 mg by mouth 2 (two) times daily.     . propranolol (INDERAL) 20 MG tablet Take 20 mg by mouth every morning. TAKES FOR ANXIETY  5  . rosuvastatin (CRESTOR) 5 MG tablet Take 5 mg by mouth every morning.     . vitamin B-12 (CYANOCOBALAMIN) 500 MCG tablet Take 500 mcg by mouth 2 (two) times a week.      Allergies  Allergen Reactions  . Lisinopril Cough  . Penicillin G Rash    Did it involve swelling of the face/tongue/throat, SOB, or low BP? No Did it involve sudden or severe rash/hives, skin peeling, or any reaction on the inside of your mouth or  nose? No Did you need to seek medical attention at a hospital or doctor's office? No When did it last happen?Patient states occurred in [place] If all above answers are "NO", may proceed with cephalosporin use.     Social History:   reports that she has never smoked. She has never used smokeless tobacco. She reports that she does not drink alcohol or use drugs.  Family History  Problem Relation Age of Onset  . Hypertension Mother   . Heart attack Father   . Heart failure Father     Review of Systems: Noncontributory  PHYSICAL EXAM: Blood pressure (!) 151/91, pulse 74, temperature 97.7 F (36.5 C), temperature source Oral, resp. rate 16, height 5\' 4"  (1.626 m), weight 73.5 kg, last menstrual period 08/26/2018, SpO2 99 %. General appearance - alert, well appearing, and in no distress Chest - clear to auscultation, no wheezes, rales or rhonchi, symmetric air entry Heart - normal rate and regular rhythm Abdomen - soft, nontender, nondistended, no masses or organomegaly Pelvic - examination not indicated Extremities - peripheral pulses normal, no pedal edema, no clubbing or cyanosis  Labs: Results for orders placed or performed during the hospital encounter of 09/14/18 (from the past 336 hour(s))  Glucose, capillary   Collection Time: 09/14/18  6:17 AM  Result Value Ref Range  Glucose-Capillary 118 (H) 70 - 99 mg/dL  Pregnancy, urine POC   Collection Time: 09/14/18  6:17 AM  Result Value Ref Range   Preg Test, Ur NEGATIVE NEGATIVE  Type and screen [place]   Collection Time: 09/14/18  6:20 AM  Result Value Ref Range   ABO/RH(D) PENDING    Antibody Screen PENDING    Sample Expiration      09/17/2018 Performed at [facility], [address] [place], [place] [address]   Results for orders placed or performed during the hospital encounter of 09/06/18 (from the past 336 hour(s))  Basic metabolic panel   Collection Time: 09/05/18  8:58 PM   Result Value Ref Range   Sodium 137 135 - 145 mmol/L   Potassium 3.7 3.5 - 5.1 mmol/L   Chloride 107 98 - 111 mmol/L   CO2 24 22 - 32 mmol/L   Glucose, Bld 170 (H) 70 - 99 mg/dL   BUN 10 6 - 20 mg/dL   Creatinine, Ser 0.70 0.44 - 1.00 mg/dL   Calcium 9.0 8.9 - 10.3 mg/dL   GFR calc non Af Amer >60 >60 mL/min   GFR calc Af Amer >60 >60 mL/min   Anion gap 6 5 - 15  CBC   Collection Time: 09/05/18  8:58 PM  Result Value Ref Range   WBC 10.6 (H) 4.0 - 10.5 K/uL   RBC 4.31 3.87 - 5.11 MIL/uL   Hemoglobin 12.0 12.0 - 15.0 g/dL   HCT 37.0 36.0 - 46.0 %   MCV 85.8 80.0 - 100.0 fL   MCH 27.8 26.0 - 34.0 pg   MCHC 32.4 30.0 - 36.0 g/dL   RDW 13.8 11.5 - 15.5 %   Platelets 302 150 - 400 K/uL   nRBC 0.0 0.0 - 0.2 %  Pregnancy, urine POC   Collection Time: 09/05/18  9:02 PM  Result Value Ref Range   Preg Test, Ur NEGATIVE NEGATIVE    Imaging Studies: US Transvaginal Non-ob  Result Date: 09/06/2018 CLINICAL DATA:  58 year old female with heavy vaginal bleeding times 10 days. Patient is perimenopausal. LMP: 08/26/2018 EXAM: TRANSABDOMINAL AND TRANSVAGINAL ULTRASOUND OF PELVIS TECHNIQUE: Both transabdominal and transvaginal ultrasound examinations of the pelvis were performed. Transabdominal technique was performed for global imaging of the pelvis including uterus, ovaries, adnexal regions, and pelvic cul-de-sac. It was necessary to proceed with endovaginal exam following the transabdominal exam to visualize the endometrium and ovaries. COMPARISON:  None FINDINGS: Uterus Measurements: 10.6 x 6.0 x 6.1 cm = volume: 202.5 mL. The uterus is anteverted and heterogeneous. There is a 10.2 x 6.8 x 7.0 cm posterior body fibroid. Endometrium Thickness: 7 mm. The endometrium is poorly visualized and suboptimally evaluated. Right ovary Measurements: Not visualized Left ovary Measurements: 2.6 x 1.8 x 2.0 cm = volume: 4.8 mL. There is a physiologic follicle/cyst in the left ovary. Other findings No abnormal  free fluid. IMPRESSION: 1. Heterogeneous uterus with a dominant fibroid. 2. Suboptimal evaluation of the endometrium. 3. Unremarkable left ovary.  Nonvisualization of the right ovary. Electronically Signed   By: Anner Crete M.D.   On: 09/06/2018 02:45   US Pelvis Complete  Result Date: 09/06/2018 CLINICAL DATA:  58 year old female with heavy vaginal bleeding times 10 days. Patient is perimenopausal. LMP: 08/26/2018 EXAM: TRANSABDOMINAL AND TRANSVAGINAL ULTRASOUND OF PELVIS TECHNIQUE: Both transabdominal and transvaginal ultrasound examinations of the pelvis were performed. Transabdominal technique was performed for global imaging of the pelvis including uterus, ovaries, adnexal regions, and pelvic  cul-de-sac. It was necessary to proceed with endovaginal exam following the transabdominal exam to visualize the endometrium and ovaries. COMPARISON:  None FINDINGS: Uterus Measurements: 10.6 x 6.0 x 6.1 cm = volume: 202.5 mL. The uterus is anteverted and heterogeneous. There is a 10.2 x 6.8 x 7.0 cm posterior body fibroid. Endometrium Thickness: 7 mm. The endometrium is poorly visualized and suboptimally evaluated. Right ovary Measurements: Not visualized Left ovary Measurements: 2.6 x 1.8 x 2.0 cm = volume: 4.8 mL. There is a physiologic follicle/cyst in the left ovary. Other findings No abnormal free fluid. IMPRESSION: 1. Heterogeneous uterus with a dominant fibroid. 2. Suboptimal evaluation of the endometrium. 3. Unremarkable left ovary.  Nonvisualization of the right ovary. Electronically Signed   By: Anner Crete M.D.   On: 09/06/2018 02:45    Assessment: Abnormal uterine bleeding  Plan: Patient will undergo surgical management with D&C hysteroscopy.   The risks of surgery were discussed in detail with the patient including but not limited to: bleeding which may require transfusion or reoperation; infection which may require antibiotics; injury to surrounding organs which may involve bowel,  bladder, ureters ; need for additional procedures including laparoscopy or laparotomy; thromboembolic phenomenon, surgical site problems and other postoperative/anesthesia complications. Likelihood of success in alleviating the patient's condition was discussed. Routine postoperative instructions will be reviewed with the patient and her family in detail after surgery.  The patient concurred with the proposed plan, giving informed written consent for the surgery.  Patient has been NPO since last night she will remain NPO for procedure.  Anesthesia and OR aware.  To OR when ready.  ----- Larey Days, MD Attending Obstetrician and Gynecologist [facility], Department of [facility]

## 2018-09-14 NOTE — Op Note (Addendum)
Operative Report Hysteroscopy, Dilation and Curettage Vaginal Hysterectomy with Bilateral Salpingectomy 09/14/2018  Patient:  Karla Hunt  58 y.o. female Preoperative diagnosis:  Abnormal uterine bleeding, fibroid uterus Postoperative diagnosis:  Abnormal uterine bleeding, fibroid uterus  PROCEDURE:  Procedure(s): DILATATION AND CURETTAGE /HYSTEROSCOPY (N/A) HYSTERECTOMY VAGINAL (N/A)  BILATERAL SALPINGECTOMY  Surgeon:  Surgeon(s) and Role:    * Kaelon Weekes, Honor Loh, MD - Primary    * Schermerhorn, Gwen Her, MD - Assisting Anesthesia:  General by LMA I/O: Total I/O In: 2470 [I.V.:1650; Blood:820] Out: 1500 [Urine:300; Blood:1200] Specimens:  Endometrial curettings, uterus and cervix (in pieces) with bilateral tubes Complications: None Apparent Disposition:  VS stable to PACU  Findings: Uterus, mobile, enlarged size with right posterior 10cm fibroid, sounding to 11 cm; normal cervix, vagina, perineum.  There was bleeding from the cervix that was moderate upon initial exam. Once the sound was passed, bleeding became brisk. Through the external os there was a visible mass, occupying the right cervical canal.  There were no walls visible, and not a polyp.   Endometrial cavity was not able to be adequately distended. Normal appearing tubes and ovaries bilaterally.  Indication for procedure/Consents: 58 y.o. here for scheduled surgery for the aforementioned diagnoses.  Risks of surgery were discussed with the patient including but not limited to: bleeding which may require transfusion; infection which may require antibiotics; injury to uterus or surrounding organs; intrauterine scarring which may impair future fertility; need for additional procedures including laparotomy or laparoscopy; and other postoperative/anesthesia complications. Written informed consent was obtained.    Procedure Details:   The patient was then taken to the operating room where anesthesia was administered.  After a  formal timeout was performed, she was placed in the dorsal lithotomy position and examined with the above findings. She was then prepped and draped in the sterile manner.  A speculum was then placed in the patient's vagina and a single tooth tenaculum was applied to the anterior lip of the cervix.   The uterus was sounded to 11cm. Her cervix was serially dilated to accommodate the myoscope, with findings as above.  The bleeding became brisk, and was confirmed to be coming from the mass seen through the cervical os and not from within the endometrial cavity.  Thus, interventions were limited, and I decided to proceed with a vaginal hysterectomy to control the bleeding.  I left the operating room briefly to speak to the patient's family member (husband) to discuss the situation and ask for consent to proceed.  I explained my dilemma of not having many options, and he agreed to allow me to proceed with the hysterectomy.  A preop nurse witnessed this conversation and signed a written consent with him.  I returned to the OR and the staff was preparing the room for the conversion.  Anesthesia attempted to convert to GET but was unable and she remained on an LMA.  She was given 2g Ancef and 1u PRBC  The weighted speculum was inserted into the vagina, and thyroid clamps were placed on the anterior and posterior cervix.  20cc of lidocaine and epinephrine was injected circumferentially into and around the cervix.  The posterior vagina was grasped with an Alis clamp and scissors were used to enter the posterior peritoneum.  The long billed weighted speculum was then inserted above the rectum.  Haney clamps were then used sequentially to ligate and transect the bilateral uterosacral ligaments, cardinal ligaments, uterine arteries, parametrium, and cornua without difficulty, and with occasional decompression of  the uterus via central morcellation throughout these steps.  0-Vicryl was used in a Actuary with each bite.     The bilateral fallopian tubes were separated from the mesosalpinx with the Haney clamp and suture tied behind the clamp. Hemostasis was noted at all pedicles.   The vaginal mucosa closed anterior to posterior with a running stitch of 0-vicryl.  Long-acting bupivacaine was injected into the vaginal mucosa and underlying tissues in the pudendal distribution bilaterally.  A red-rubber catheter was again placed in the bladder and 100cc of amber urine was returned.   The patient tolerated the procedure well.  All instruments, needles, and sponge counts were correct x 2. The patient was taken to the recovery room in stable condition.   ----- Larey Days, MD Attending Obstetrician and Gynecologist [facility], Department of [facility]

## 2018-09-14 NOTE — Progress Notes (Signed)
Witnessed Dr. Leonides Schanz discuss with patient's husband, Deidre Ala about changing the operation from a D&C to a vaginal hysterectomy. Patient's husband was agreeable with the change. Patient is in the operating room under anesthesia. New consent form signed by patient's husband Deidre Ala

## 2018-09-14 NOTE — Anesthesia Postprocedure Evaluation (Addendum)
Anesthesia Post Note  Patient: Administrator  Procedure(s) Performed: DILATATION AND CURETTAGE /HYSTEROSCOPY (N/A ) HYSTERECTOMY VAGINAL (N/A )  Patient location during evaluation: PACU Anesthesia Type: General Level of consciousness: awake and alert and oriented Pain management: pain level controlled Vital Signs Assessment: post-procedure vital signs reviewed and stable Respiratory status: spontaneous breathing Cardiovascular status: blood pressure returned to baseline Anesthetic complications: no Comments: Very short TM distance of about one fingerbreadth and very small mouth.  The LMA worked well for the Hysteroscopy portion, but it was decided to convert to a total hysterectomy and secure the airway with an Ett.  Unable to visualize with a McGrath, but with a glidescope we could see the cords.  We did not have a manipulatable bougie, so the LMA was replaced with excellent airway control and we dicided to go with this to avoid further airway manipulation.  Patient was told by her dentist that she had a very small mouth with not much room.  I believe that the patient could be intubated with a manipulatable stylet.   Last Vitals:  Vitals:   09/14/18 1144 09/14/18 1226  BP: (!) 161/76 (!) 158/86  Pulse: 73 78  Resp: 16 12  Temp: 37 C   SpO2: 100% 99%    Last Pain:  Vitals:   09/14/18 1226  TempSrc:   PainSc: 3                  Karla Hunt

## 2018-09-14 NOTE — Transfer of Care (Signed)
Immediate Anesthesia Transfer of Care Note  Patient: Karla Hunt  Procedure(s) Performed: DILATATION AND CURETTAGE /HYSTEROSCOPY (N/A ) HYSTERECTOMY VAGINAL (N/A )  Patient Location: PACU  Anesthesia Type:General  Level of Consciousness: awake, alert  and oriented  Airway & Oxygen Therapy: Patient Spontanous Breathing and Patient connected to face mask oxygen  Post-op Assessment: Report given to RN and Post -op Vital signs reviewed and stable  Post vital signs: Reviewed and stable  Last Vitals:  Vitals Value Taken Time  BP 134/88 09/14/2018 10:30 AM  Temp 36.3 C 09/14/2018 10:25 AM  Pulse 73 09/14/2018 10:31 AM  Resp 0 09/14/2018 10:31 AM  SpO2 100 % 09/14/2018 10:31 AM  Vitals shown include unvalidated device data.  Last Pain:  Vitals:   09/14/18 0615  TempSrc: Oral  PainSc: 0-No pain         Complications: No apparent anesthesia complications

## 2018-09-14 NOTE — Progress Notes (Signed)
Blood finished up at 10:30   Temp 97.3   Vital signs stable

## 2018-09-14 NOTE — Anesthesia Preprocedure Evaluation (Signed)
Anesthesia Evaluation  Patient identified by MRN, date of birth, ID band Patient awake    Reviewed: Allergy & Precautions, NPO status , Patient's Chart, lab work & pertinent test results  Airway Mallampati: IV  TM Distance: <3 FB     Dental  (+) Teeth Intact   Pulmonary neg pulmonary ROS,    Pulmonary exam normal        Cardiovascular hypertension, Normal cardiovascular exam+ Valvular Problems/Murmurs      Neuro/Psych Anxiety negative neurological ROS     GI/Hepatic negative GI ROS, Neg liver ROS,   Endo/Other  diabetesHypothyroidism   Renal/GU negative Renal ROS  negative genitourinary   Musculoskeletal   Abdominal Normal abdominal exam  (+)   Peds negative pediatric ROS (+)  Hematology   Anesthesia Other Findings   Reproductive/Obstetrics                             Anesthesia Physical Anesthesia Plan  ASA: II  Anesthesia Plan: General   Post-op Pain Management:    Induction: Intravenous  PONV Risk Score and Plan:   Airway Management Planned: LMA  Additional Equipment:   Intra-op Plan:   Post-operative Plan:   Informed Consent: I have reviewed the patients History and Physical, chart, labs and discussed the procedure including the risks, benefits and alternatives for the proposed anesthesia with the patient or authorized representative who has indicated his/her understanding and acceptance.     Dental advisory given  Plan Discussed with: CRNA and Surgeon  Anesthesia Plan Comments:         Anesthesia Quick Evaluation

## 2018-09-15 LAB — TYPE AND SCREEN
ABO/RH(D): O POS
ANTIBODY SCREEN: NEGATIVE
Unit division: 0
Unit division: 0

## 2018-09-15 LAB — BPAM RBC
BLOOD PRODUCT EXPIRATION DATE: 202003312359
Blood Product Expiration Date: 202003312359
ISSUE DATE / TIME: 202003040844
ISSUE DATE / TIME: 202003040914
Unit Type and Rh: 5100
Unit Type and Rh: 5100

## 2018-09-20 LAB — SURGICAL PATHOLOGY

## 2019-02-01 ENCOUNTER — Other Ambulatory Visit: Payer: Self-pay | Admitting: Nurse Practitioner

## 2019-02-01 DIAGNOSIS — Z1231 Encounter for screening mammogram for malignant neoplasm of breast: Secondary | ICD-10-CM

## 2019-04-27 ENCOUNTER — Inpatient Hospital Stay: Admission: RE | Admit: 2019-04-27 | Payer: BLUE CROSS/BLUE SHIELD | Source: Ambulatory Visit

## 2020-03-20 IMAGING — US US TRANSVAGINAL NON-OB
1 series · 13 of 25 positions shown · non-contrast
Comparison: None

CLINICAL DATA: 57-year-old female with heavy vaginal bleeding times
10 days. Patient is perimenopausal. LMP: 08/26/2018

EXAM:
TRANSABDOMINAL AND TRANSVAGINAL ULTRASOUND OF PELVIS
TECHNIQUE: Both transabdominal and transvaginal ultrasound examinations of the
pelvis were performed. Transabdominal technique was performed for
global imaging of the pelvis including uterus, ovaries, adnexal
regions, and pelvic cul-de-sac. It was necessary to proceed with
endovaginal exam following the transabdominal exam to visualize the
endometrium and ovaries.

[Series 1: us transvaginal non-ob · 0.25mm/px · 13 of 61 slices shown]
[im 1/61]
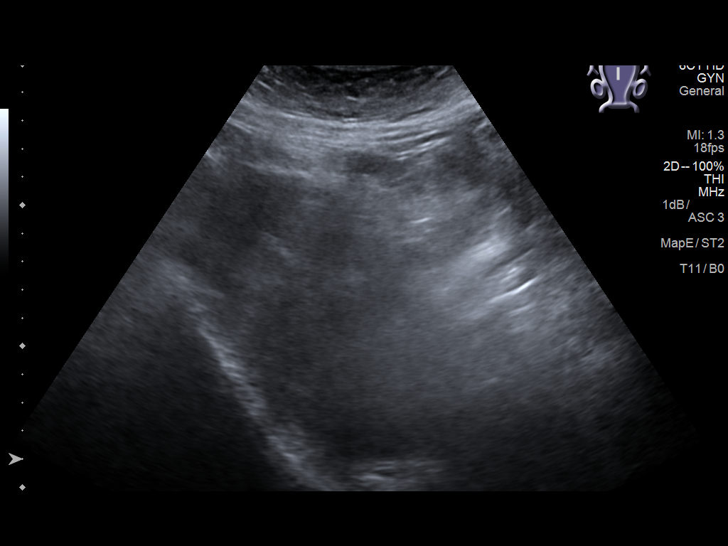
[im 6/61]
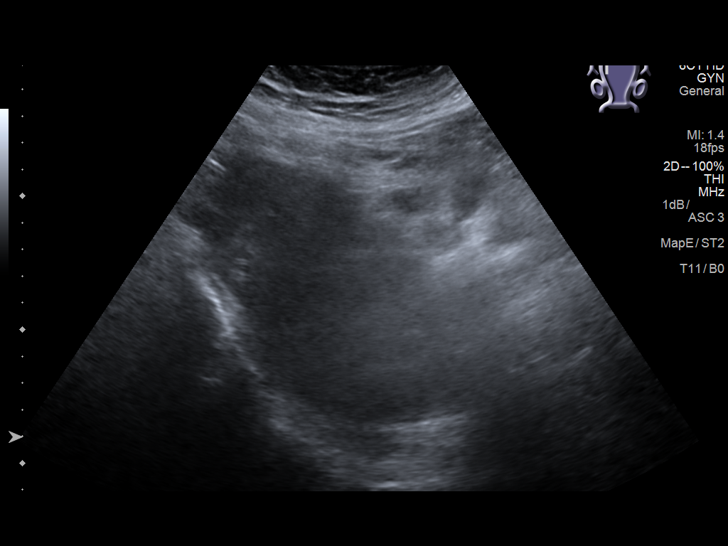
[im 11/61]
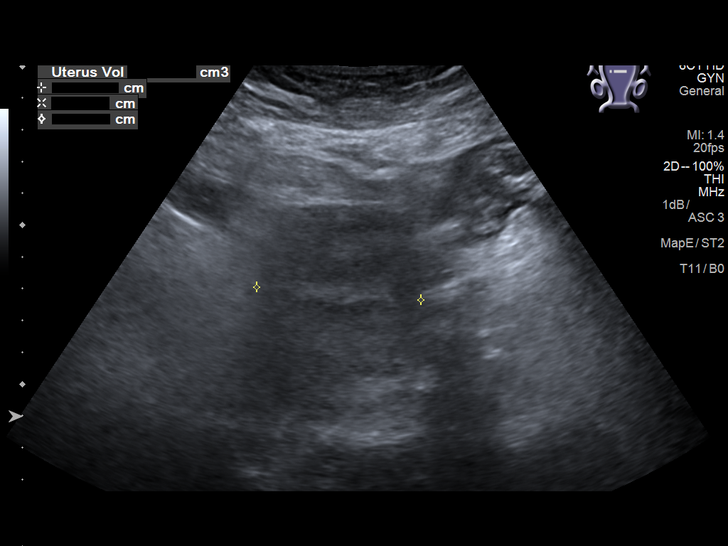
[im 16/61]
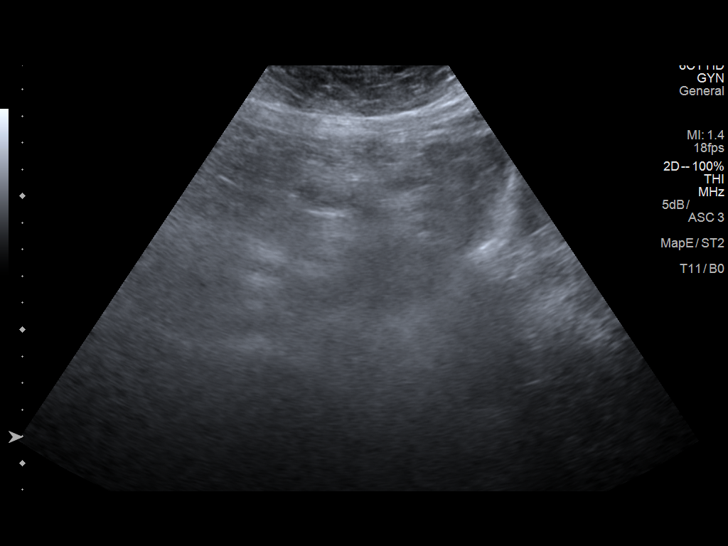
[im 21/61]
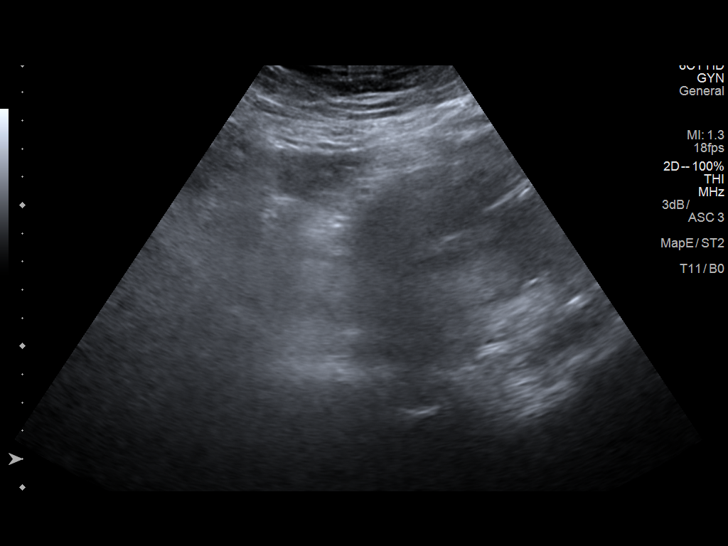
[im 26/61]
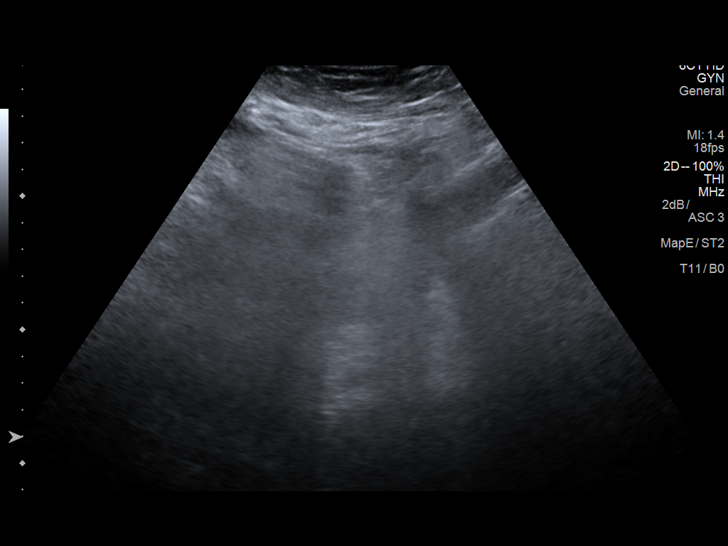
[im 31/61]
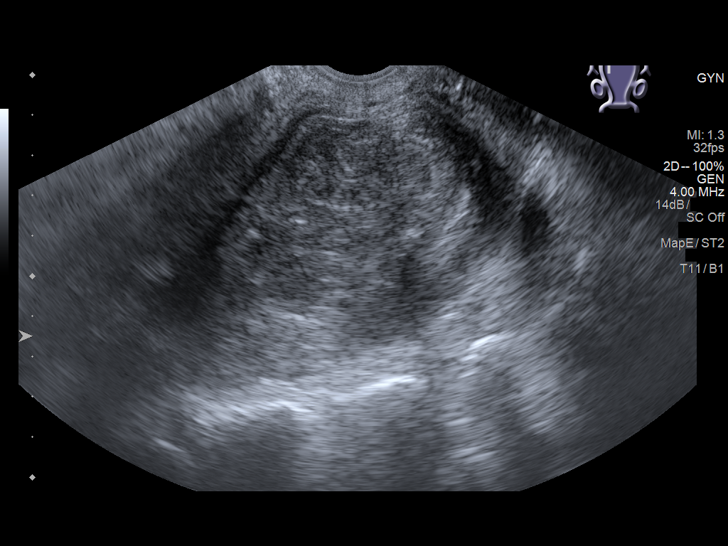
[im 36/61]
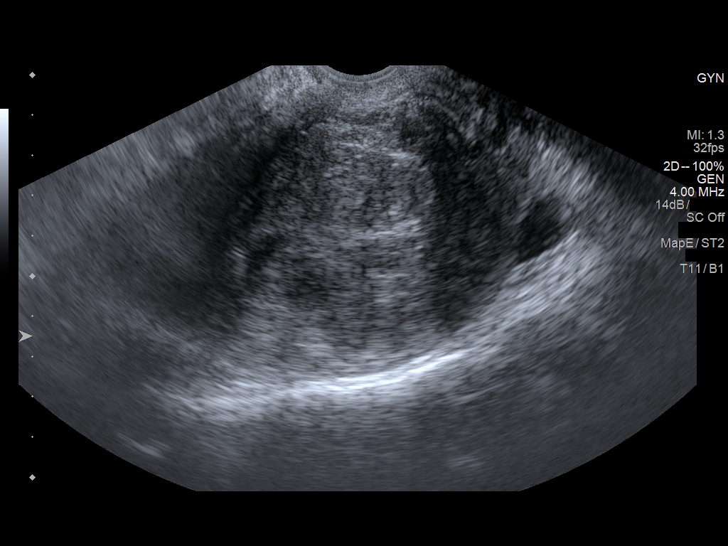
[im 41/61]
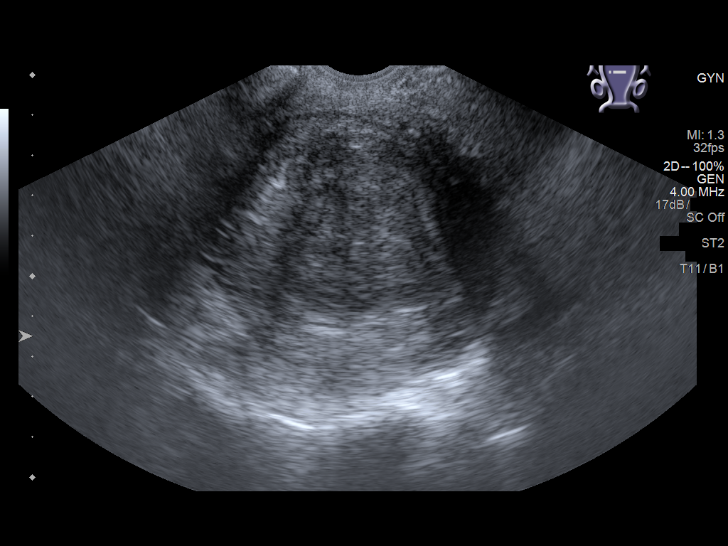
[im 46/61]
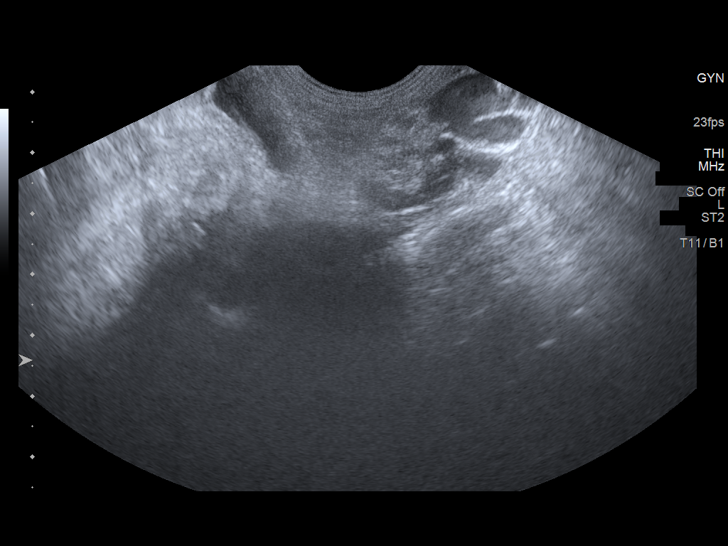
[im 51/61]
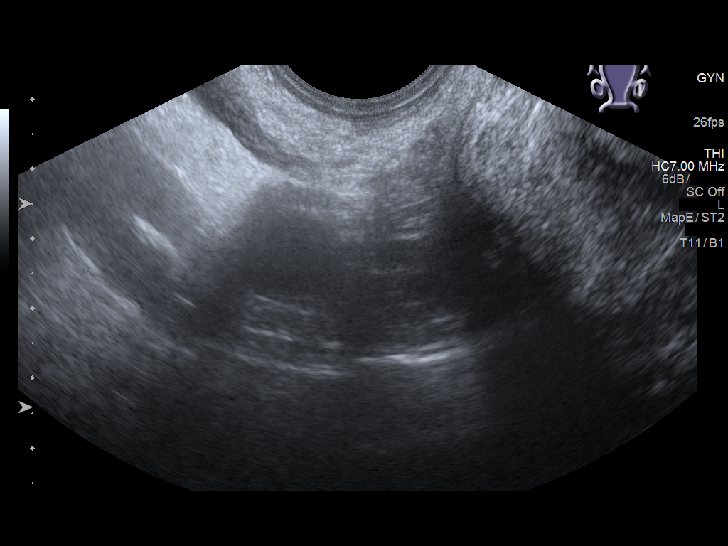
[im 56/61]
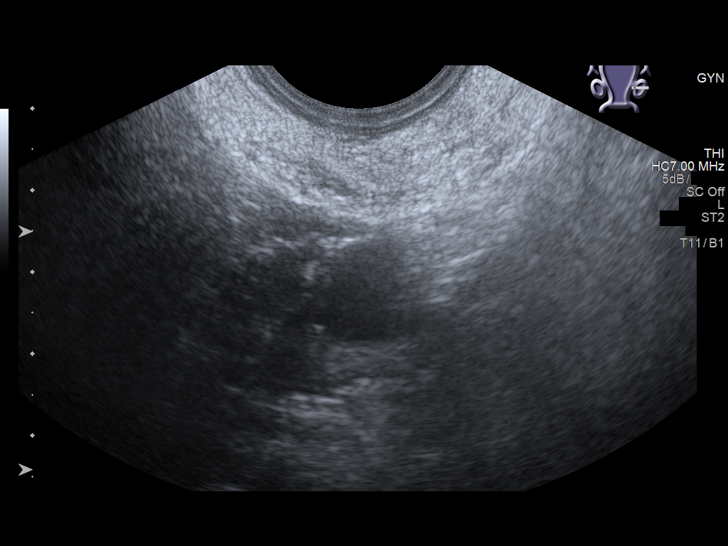
[im 61/61]
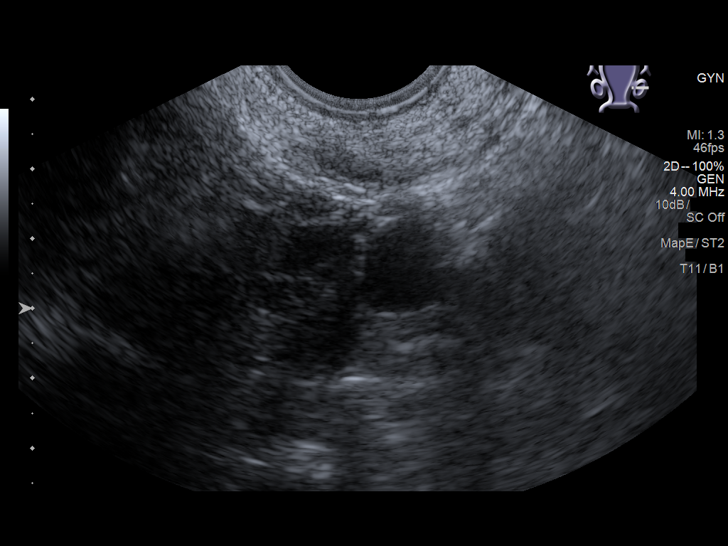

[13 of 25 positions shown; findings below may reference images not displayed]

FINDINGS: Uterus

Measurements: 10.6 x 6.0 x 6.1 cm = volume: 202.5 mL. The uterus is
anteverted and heterogeneous.

There is a 10.2 x 6.8 x 7.0 cm posterior body fibroid.

Endometrium

Thickness: 7 mm. The endometrium is poorly visualized and
suboptimally evaluated.

Right ovary

Measurements: Not visualized

Left ovary

Measurements: 2.6 x 1.8 x 2.0 cm = volume: 4.8 mL. There is a
physiologic follicle/cyst in the left ovary.

Other findings

No abnormal free fluid.
IMPRESSION: 1. Heterogeneous uterus with a dominant fibroid.
2. Suboptimal evaluation of the endometrium.
3. Unremarkable left ovary.  Nonvisualization of the right ovary.

## 2022-09-18 ENCOUNTER — Other Ambulatory Visit: Payer: Self-pay | Admitting: Nurse Practitioner

## 2022-09-18 NOTE — Telephone Encounter (Signed)
Patient should call provider first as [place]

## 2022-09-22 ENCOUNTER — Other Ambulatory Visit: Payer: Self-pay | Admitting: Nurse Practitioner

## 2022-09-24 NOTE — Telephone Encounter (Signed)
No longer pt here

## 2022-09-30 ENCOUNTER — Other Ambulatory Visit: Payer: Self-pay | Admitting: Nurse Practitioner

## 2023-01-16 ENCOUNTER — Other Ambulatory Visit: Payer: Self-pay | Admitting: Nurse Practitioner

## 2023-03-16 ENCOUNTER — Other Ambulatory Visit: Payer: Self-pay

## 2023-03-17 ENCOUNTER — Other Ambulatory Visit: Payer: Self-pay
